# Patient Record
Sex: Female | Born: 1980 | Race: Black or African American | Hispanic: No | Marital: Single | Smoking: Current every day smoker
Health system: Southern US, Community
[De-identification: ages and names within clinical notes are randomized; demographics above are authoritative.]

## PROBLEM LIST (undated history)

## (undated) DIAGNOSIS — J45909 Unspecified asthma, uncomplicated: Secondary | ICD-10-CM

## (undated) DIAGNOSIS — T7840XA Allergy, unspecified, initial encounter: Secondary | ICD-10-CM

## (undated) DIAGNOSIS — A549 Gonococcal infection, unspecified: Secondary | ICD-10-CM

## (undated) DIAGNOSIS — L409 Psoriasis, unspecified: Secondary | ICD-10-CM

## (undated) DIAGNOSIS — B999 Unspecified infectious disease: Secondary | ICD-10-CM

## (undated) DIAGNOSIS — A599 Trichomoniasis, unspecified: Secondary | ICD-10-CM

## (undated) DIAGNOSIS — N83209 Unspecified ovarian cyst, unspecified side: Secondary | ICD-10-CM

## (undated) HISTORY — DX: Unspecified asthma, uncomplicated: J45.909

## (undated) HISTORY — DX: Allergy, unspecified, initial encounter: T78.40XA

## (undated) HISTORY — PX: STOMACH SURGERY: SHX791

---
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
## 1998-01-05 ENCOUNTER — Encounter: Admission: RE | Admit: 1998-01-05 | Discharge: 1998-01-05 | Payer: Self-pay | Admitting: Family Medicine

## 1998-01-12 ENCOUNTER — Encounter: Admission: RE | Admit: 1998-01-12 | Discharge: 1998-01-12 | Payer: Self-pay | Admitting: Family Medicine

## 1998-03-08 ENCOUNTER — Other Ambulatory Visit: Admission: RE | Admit: 1998-03-08 | Discharge: 1998-03-08 | Payer: Self-pay | Admitting: Family Medicine

## 1998-05-27 ENCOUNTER — Encounter: Admission: RE | Admit: 1998-05-27 | Discharge: 1998-05-27 | Payer: Self-pay | Admitting: Family Medicine

## 1998-07-22 ENCOUNTER — Encounter: Admission: RE | Admit: 1998-07-22 | Discharge: 1998-07-22 | Payer: Self-pay | Admitting: Family Medicine

## 1998-08-17 ENCOUNTER — Encounter: Admission: RE | Admit: 1998-08-17 | Discharge: 1998-08-17 | Payer: Self-pay | Admitting: Family Medicine

## 1998-08-24 ENCOUNTER — Encounter: Admission: RE | Admit: 1998-08-24 | Discharge: 1998-08-24 | Payer: Self-pay | Admitting: Family Medicine

## 1998-10-12 ENCOUNTER — Encounter: Admission: RE | Admit: 1998-10-12 | Discharge: 1998-10-12 | Payer: Self-pay | Admitting: Family Medicine

## 1998-10-14 ENCOUNTER — Encounter: Admission: RE | Admit: 1998-10-14 | Discharge: 1998-10-14 | Payer: Self-pay | Admitting: Family Medicine

## 1998-12-04 ENCOUNTER — Inpatient Hospital Stay (HOSPITAL_COMMUNITY): Admission: AD | Admit: 1998-12-04 | Discharge: 1998-12-04 | Payer: Self-pay | Admitting: Obstetrics

## 1998-12-06 ENCOUNTER — Inpatient Hospital Stay (HOSPITAL_COMMUNITY): Admission: AD | Admit: 1998-12-06 | Discharge: 1998-12-06 | Payer: Self-pay | Admitting: *Deleted

## 1999-01-09 ENCOUNTER — Encounter: Admission: RE | Admit: 1999-01-09 | Discharge: 1999-01-09 | Payer: Self-pay | Admitting: Family Medicine

## 1999-01-31 ENCOUNTER — Encounter: Admission: RE | Admit: 1999-01-31 | Discharge: 1999-01-31 | Payer: Self-pay | Admitting: Family Medicine

## 1999-03-02 ENCOUNTER — Encounter: Admission: RE | Admit: 1999-03-02 | Discharge: 1999-03-02 | Payer: Self-pay | Admitting: Family Medicine

## 1999-05-12 ENCOUNTER — Inpatient Hospital Stay (HOSPITAL_COMMUNITY): Admission: AD | Admit: 1999-05-12 | Discharge: 1999-05-12 | Payer: Self-pay | Admitting: Obstetrics & Gynecology

## 1999-05-16 ENCOUNTER — Emergency Department (HOSPITAL_COMMUNITY): Admission: EM | Admit: 1999-05-16 | Discharge: 1999-05-16 | Payer: Self-pay | Admitting: Emergency Medicine

## 1999-05-18 ENCOUNTER — Inpatient Hospital Stay (HOSPITAL_COMMUNITY): Admission: EM | Admit: 1999-05-18 | Discharge: 1999-05-20 | Payer: Self-pay | Admitting: Emergency Medicine

## 1999-05-18 ENCOUNTER — Encounter: Admission: RE | Admit: 1999-05-18 | Discharge: 1999-05-18 | Payer: Self-pay | Admitting: Family Medicine

## 1999-05-25 ENCOUNTER — Encounter: Admission: RE | Admit: 1999-05-25 | Discharge: 1999-05-25 | Payer: Self-pay | Admitting: Family Medicine

## 1999-05-29 ENCOUNTER — Encounter: Admission: RE | Admit: 1999-05-29 | Discharge: 1999-05-29 | Payer: Self-pay | Admitting: Family Medicine

## 1999-07-27 ENCOUNTER — Encounter: Admission: RE | Admit: 1999-07-27 | Discharge: 1999-07-27 | Payer: Self-pay | Admitting: Sports Medicine

## 1999-08-03 ENCOUNTER — Encounter: Admission: RE | Admit: 1999-08-03 | Discharge: 1999-08-03 | Payer: Self-pay | Admitting: Family Medicine

## 1999-11-14 ENCOUNTER — Inpatient Hospital Stay (HOSPITAL_COMMUNITY): Admission: AD | Admit: 1999-11-14 | Discharge: 1999-11-14 | Payer: Self-pay | Admitting: Obstetrics

## 1999-11-27 ENCOUNTER — Encounter: Admission: RE | Admit: 1999-11-27 | Discharge: 1999-11-27 | Payer: Self-pay | Admitting: Family Medicine

## 1999-11-27 ENCOUNTER — Other Ambulatory Visit: Admission: RE | Admit: 1999-11-27 | Discharge: 1999-11-27 | Payer: Self-pay | Admitting: Family Medicine

## 1999-11-29 ENCOUNTER — Encounter (INDEPENDENT_AMBULATORY_CARE_PROVIDER_SITE_OTHER): Payer: Self-pay | Admitting: *Deleted

## 1999-11-29 LAB — CONVERTED CEMR LAB

## 1999-12-21 ENCOUNTER — Encounter: Admission: RE | Admit: 1999-12-21 | Discharge: 1999-12-21 | Payer: Self-pay | Admitting: Family Medicine

## 2000-04-01 ENCOUNTER — Inpatient Hospital Stay (HOSPITAL_COMMUNITY): Admission: AD | Admit: 2000-04-01 | Discharge: 2000-04-01 | Payer: Self-pay | Admitting: Obstetrics

## 2000-08-21 ENCOUNTER — Inpatient Hospital Stay (HOSPITAL_COMMUNITY): Admission: AD | Admit: 2000-08-21 | Discharge: 2000-08-21 | Payer: Self-pay | Admitting: Obstetrics

## 2001-02-14 ENCOUNTER — Emergency Department (HOSPITAL_COMMUNITY): Admission: EM | Admit: 2001-02-14 | Discharge: 2001-02-14 | Payer: Self-pay | Admitting: Emergency Medicine

## 2001-03-06 ENCOUNTER — Emergency Department (HOSPITAL_COMMUNITY): Admission: EM | Admit: 2001-03-06 | Discharge: 2001-03-06 | Payer: Self-pay | Admitting: Emergency Medicine

## 2001-03-17 ENCOUNTER — Emergency Department (HOSPITAL_COMMUNITY): Admission: EM | Admit: 2001-03-17 | Discharge: 2001-03-17 | Payer: Self-pay

## 2001-03-27 ENCOUNTER — Inpatient Hospital Stay (HOSPITAL_COMMUNITY): Admission: AD | Admit: 2001-03-27 | Discharge: 2001-03-27 | Payer: Self-pay | Admitting: Obstetrics & Gynecology

## 2001-07-08 ENCOUNTER — Emergency Department (HOSPITAL_COMMUNITY): Admission: EM | Admit: 2001-07-08 | Discharge: 2001-07-08 | Payer: Self-pay

## 2001-08-21 ENCOUNTER — Emergency Department (HOSPITAL_COMMUNITY): Admission: EM | Admit: 2001-08-21 | Discharge: 2001-08-21 | Payer: Self-pay | Admitting: Emergency Medicine

## 2001-08-25 ENCOUNTER — Encounter: Admission: RE | Admit: 2001-08-25 | Discharge: 2001-08-25 | Payer: Self-pay | Admitting: Family Medicine

## 2001-09-10 ENCOUNTER — Encounter: Admission: RE | Admit: 2001-09-10 | Discharge: 2001-09-10 | Payer: Self-pay | Admitting: Family Medicine

## 2002-10-30 ENCOUNTER — Emergency Department (HOSPITAL_COMMUNITY): Admission: EM | Admit: 2002-10-30 | Discharge: 2002-10-30 | Payer: Self-pay | Admitting: Emergency Medicine

## 2003-01-07 ENCOUNTER — Inpatient Hospital Stay (HOSPITAL_COMMUNITY): Admission: AD | Admit: 2003-01-07 | Discharge: 2003-01-07 | Payer: Self-pay | Admitting: Obstetrics & Gynecology

## 2003-01-07 ENCOUNTER — Encounter: Payer: Self-pay | Admitting: Obstetrics & Gynecology

## 2004-01-17 ENCOUNTER — Emergency Department (HOSPITAL_COMMUNITY): Admission: EM | Admit: 2004-01-17 | Discharge: 2004-01-17 | Payer: Self-pay | Admitting: Emergency Medicine

## 2004-10-01 ENCOUNTER — Emergency Department (HOSPITAL_COMMUNITY): Admission: EM | Admit: 2004-10-01 | Discharge: 2004-10-02 | Payer: Self-pay | Admitting: Emergency Medicine

## 2004-10-12 ENCOUNTER — Other Ambulatory Visit: Admission: RE | Admit: 2004-10-12 | Discharge: 2004-10-12 | Payer: Self-pay | Admitting: Obstetrics and Gynecology

## 2004-11-06 ENCOUNTER — Emergency Department (HOSPITAL_COMMUNITY): Admission: EM | Admit: 2004-11-06 | Discharge: 2004-11-06 | Payer: Self-pay | Admitting: Emergency Medicine

## 2005-06-17 ENCOUNTER — Inpatient Hospital Stay (HOSPITAL_COMMUNITY): Admission: AD | Admit: 2005-06-17 | Discharge: 2005-06-17 | Payer: Self-pay | Admitting: *Deleted

## 2005-06-21 ENCOUNTER — Emergency Department (HOSPITAL_COMMUNITY): Admission: EM | Admit: 2005-06-21 | Discharge: 2005-06-21 | Payer: Self-pay | Admitting: Emergency Medicine

## 2005-08-03 ENCOUNTER — Inpatient Hospital Stay (HOSPITAL_COMMUNITY): Admission: AD | Admit: 2005-08-03 | Discharge: 2005-08-03 | Payer: Self-pay | Admitting: Obstetrics and Gynecology

## 2006-01-23 ENCOUNTER — Emergency Department (HOSPITAL_COMMUNITY): Admission: EM | Admit: 2006-01-23 | Discharge: 2006-01-23 | Payer: Self-pay | Admitting: Emergency Medicine

## 2006-06-27 DIAGNOSIS — L989 Disorder of the skin and subcutaneous tissue, unspecified: Secondary | ICD-10-CM

## 2006-06-27 HISTORY — DX: Disorder of the skin and subcutaneous tissue, unspecified: L98.9

## 2006-06-28 ENCOUNTER — Encounter (INDEPENDENT_AMBULATORY_CARE_PROVIDER_SITE_OTHER): Payer: Self-pay | Admitting: *Deleted

## 2006-07-14 ENCOUNTER — Inpatient Hospital Stay (HOSPITAL_COMMUNITY): Admission: AD | Admit: 2006-07-14 | Discharge: 2006-07-14 | Payer: Self-pay | Admitting: Gynecology

## 2006-10-05 ENCOUNTER — Emergency Department (HOSPITAL_COMMUNITY): Admission: EM | Admit: 2006-10-05 | Discharge: 2006-10-05 | Payer: Self-pay | Admitting: Emergency Medicine

## 2006-10-14 ENCOUNTER — Emergency Department (HOSPITAL_COMMUNITY): Admission: EM | Admit: 2006-10-14 | Discharge: 2006-10-14 | Payer: Self-pay | Admitting: Family Medicine

## 2006-11-25 ENCOUNTER — Inpatient Hospital Stay (HOSPITAL_COMMUNITY): Admission: AD | Admit: 2006-11-25 | Discharge: 2006-11-25 | Payer: Self-pay | Admitting: Obstetrics and Gynecology

## 2007-12-21 ENCOUNTER — Inpatient Hospital Stay (HOSPITAL_COMMUNITY): Admission: AD | Admit: 2007-12-21 | Discharge: 2007-12-21 | Payer: Self-pay | Admitting: Obstetrics & Gynecology

## 2008-04-08 ENCOUNTER — Ambulatory Visit: Payer: Self-pay | Admitting: Obstetrics & Gynecology

## 2008-04-08 ENCOUNTER — Encounter: Payer: Self-pay | Admitting: Physician Assistant

## 2008-04-08 LAB — CONVERTED CEMR LAB: HCV Ab: NEGATIVE

## 2008-04-09 ENCOUNTER — Encounter: Payer: Self-pay | Admitting: Obstetrics & Gynecology

## 2008-04-09 LAB — CONVERTED CEMR LAB: Trich, Wet Prep: NONE SEEN

## 2008-07-09 ENCOUNTER — Emergency Department (HOSPITAL_COMMUNITY): Admission: EM | Admit: 2008-07-09 | Discharge: 2008-07-09 | Payer: Self-pay | Admitting: Emergency Medicine

## 2009-02-06 ENCOUNTER — Inpatient Hospital Stay (HOSPITAL_COMMUNITY): Admission: AD | Admit: 2009-02-06 | Discharge: 2009-02-06 | Payer: Self-pay | Admitting: Obstetrics and Gynecology

## 2009-02-06 ENCOUNTER — Ambulatory Visit: Payer: Self-pay | Admitting: Family

## 2009-06-26 ENCOUNTER — Emergency Department (HOSPITAL_COMMUNITY): Admission: EM | Admit: 2009-06-26 | Discharge: 2009-06-26 | Payer: Self-pay | Admitting: Emergency Medicine

## 2009-08-17 ENCOUNTER — Ambulatory Visit: Payer: Self-pay | Admitting: Obstetrics & Gynecology

## 2009-08-18 ENCOUNTER — Encounter: Payer: Self-pay | Admitting: Obstetrics and Gynecology

## 2009-08-19 ENCOUNTER — Encounter: Payer: Self-pay | Admitting: Obstetrics & Gynecology

## 2009-08-19 LAB — CONVERTED CEMR LAB

## 2009-11-16 ENCOUNTER — Inpatient Hospital Stay (HOSPITAL_COMMUNITY): Admission: EM | Admit: 2009-11-16 | Discharge: 2009-11-18 | Payer: Self-pay | Admitting: Emergency Medicine

## 2009-11-16 ENCOUNTER — Encounter: Payer: Self-pay | Admitting: Emergency Medicine

## 2009-11-16 ENCOUNTER — Ambulatory Visit: Payer: Self-pay | Admitting: Diagnostic Radiology

## 2010-01-06 ENCOUNTER — Ambulatory Visit: Payer: Self-pay | Admitting: Diagnostic Radiology

## 2010-01-06 ENCOUNTER — Emergency Department (HOSPITAL_BASED_OUTPATIENT_CLINIC_OR_DEPARTMENT_OTHER): Admission: EM | Admit: 2010-01-06 | Discharge: 2010-01-06 | Payer: Self-pay | Admitting: Emergency Medicine

## 2010-05-22 ENCOUNTER — Encounter: Payer: Self-pay | Admitting: *Deleted

## 2010-07-13 LAB — DIFFERENTIAL
Basophils Relative: 3 % — ABNORMAL HIGH (ref 0–1)
Eosinophils Absolute: 0 10*3/uL (ref 0.0–0.7)
Eosinophils Relative: 1 % (ref 0–5)
Lymphs Abs: 1 10*3/uL (ref 0.7–4.0)
Monocytes Absolute: 0.5 10*3/uL (ref 0.1–1.0)
Monocytes Relative: 12 % (ref 3–12)
Neutrophils Relative %: 59 % (ref 43–77)

## 2010-07-13 LAB — CBC
HCT: 39.8 % (ref 36.0–46.0)
Hemoglobin: 13.1 g/dL (ref 12.0–15.0)
MCH: 26.4 pg (ref 26.0–34.0)
MCHC: 32.9 g/dL (ref 30.0–36.0)
MCV: 80.2 fL (ref 78.0–100.0)
RBC: 4.97 MIL/uL (ref 3.87–5.11)

## 2010-07-13 LAB — URINALYSIS, ROUTINE W REFLEX MICROSCOPIC
Nitrite: NEGATIVE
Specific Gravity, Urine: 1.005 (ref 1.005–1.030)
Urobilinogen, UA: 1 mg/dL (ref 0.0–1.0)

## 2010-07-13 LAB — WET PREP, GENITAL
Trich, Wet Prep: NONE SEEN
WBC, Wet Prep HPF POC: NONE SEEN

## 2010-07-13 LAB — GC/CHLAMYDIA PROBE AMP, GENITAL: GC Probe Amp, Genital: NEGATIVE

## 2010-07-15 LAB — CBC
HCT: 47 % — ABNORMAL HIGH (ref 36.0–46.0)
MCH: 25.7 pg — ABNORMAL LOW (ref 26.0–34.0)
MCH: 25.7 pg — ABNORMAL LOW (ref 26.0–34.0)
MCHC: 32 g/dL (ref 30.0–36.0)
MCV: 79.4 fL (ref 78.0–100.0)
MCV: 80.3 fL (ref 78.0–100.0)
Platelets: 185 10*3/uL (ref 150–400)
Platelets: 152 10*3/uL (ref 150–400)
RBC: 5.93 MIL/uL — ABNORMAL HIGH (ref 3.87–5.11)
RDW: 16.9 % — ABNORMAL HIGH (ref 11.5–15.5)
RDW: 18 % — ABNORMAL HIGH (ref 11.5–15.5)
WBC: 9.2 10*3/uL (ref 4.0–10.5)

## 2010-07-15 LAB — DIFFERENTIAL
Basophils Absolute: 0 10*3/uL (ref 0.0–0.1)
Basophils Relative: 0 % (ref 0–1)
Eosinophils Absolute: 0 10*3/uL (ref 0.0–0.7)
Eosinophils Absolute: 0 10*3/uL (ref 0.0–0.7)
Eosinophils Relative: 0 % (ref 0–5)
Eosinophils Relative: 0 % (ref 0–5)
Lymphocytes Relative: 10 % — ABNORMAL LOW (ref 12–46)
Lymphs Abs: 0.9 10*3/uL (ref 0.7–4.0)
Monocytes Absolute: 0.8 10*3/uL (ref 0.1–1.0)
Neutrophils Relative %: 84 % — ABNORMAL HIGH (ref 43–77)

## 2010-07-15 LAB — BASIC METABOLIC PANEL
BUN: 4 mg/dL — ABNORMAL LOW (ref 6–23)
BUN: <1 mg/dL — ABNORMAL LOW (ref 6–23)
CO2: 24 mEq/L (ref 19–32)
CO2: 29 mEq/L (ref 19–32)
Calcium: 8.4 mg/dL (ref 8.4–10.5)
Chloride: 107 mEq/L (ref 96–112)
Chloride: 100 mEq/L (ref 96–112)
Creatinine, Ser: 0.5 mg/dL (ref 0.4–1.2)
Creatinine, Ser: 0.46 mg/dL (ref 0.4–1.2)
GFR calc Af Amer: >60 mL/min (ref >60)
Potassium: 3.3 mEq/L — ABNORMAL LOW (ref 3.5–5.1)

## 2010-07-15 LAB — URINALYSIS, ROUTINE W REFLEX MICROSCOPIC
Bilirubin Urine: NEGATIVE
Glucose, UA: NEGATIVE mg/dL
Hgb urine dipstick: NEGATIVE
Ketones, ur: NEGATIVE mg/dL
Protein, ur: NEGATIVE mg/dL
pH: 7 (ref 5.0–8.0)

## 2010-07-21 LAB — URINALYSIS, ROUTINE W REFLEX MICROSCOPIC
Glucose, UA: NEGATIVE mg/dL
Ketones, ur: >80 mg/dL — AB
Nitrite: NEGATIVE
Protein, ur: 30 mg/dL — AB
Urobilinogen, UA: 1 mg/dL (ref 0.0–1.0)

## 2010-07-21 LAB — URINE MICROSCOPIC-ADD ON

## 2010-08-03 LAB — URINALYSIS, ROUTINE W REFLEX MICROSCOPIC
Bilirubin Urine: NEGATIVE
Hgb urine dipstick: NEGATIVE
Specific Gravity, Urine: 1.025 (ref 1.005–1.030)
Specific Gravity, Urine: >1.03 — ABNORMAL HIGH (ref 1.005–1.030)
Urobilinogen, UA: 1 mg/dL (ref 0.0–1.0)
pH: 6 (ref 5.0–8.0)
pH: 6 (ref 5.0–8.0)

## 2010-08-03 LAB — GC/CHLAMYDIA PROBE AMP, GENITAL: Chlamydia, DNA Probe: NEGATIVE

## 2010-08-03 LAB — WET PREP, GENITAL: Yeast Wet Prep HPF POC: NONE SEEN

## 2010-08-03 LAB — URINE MICROSCOPIC-ADD ON

## 2010-08-10 LAB — POCT I-STAT, CHEM 8
Calcium, Ion: 1.09 mmol/L — ABNORMAL LOW (ref 1.12–1.32)
HCT: 50 % — ABNORMAL HIGH (ref 36.0–46.0)
Hemoglobin: 17 g/dL — ABNORMAL HIGH (ref 12.0–15.0)
TCO2: 22 mmol/L (ref 0–100)
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
## 2011-02-12 LAB — URINE MICROSCOPIC-ADD ON

## 2011-02-12 LAB — URINALYSIS, ROUTINE W REFLEX MICROSCOPIC
Hgb urine dipstick: NEGATIVE
Nitrite: NEGATIVE
Specific Gravity, Urine: >1.03 — ABNORMAL HIGH
pH: 5.5

## 2011-02-12 LAB — GC/CHLAMYDIA PROBE AMP, GENITAL: Chlamydia, DNA Probe: NEGATIVE

## 2011-02-12 LAB — WET PREP, GENITAL
Trich, Wet Prep: NONE SEEN
Yeast Wet Prep HPF POC: NONE SEEN

## 2011-02-12 LAB — CBC
MCHC: 32.6
RDW: 13.7

## 2011-02-12 LAB — POCT PREGNANCY, URINE: Preg Test, Ur: NEGATIVE

## 2011-08-22 ENCOUNTER — Emergency Department (HOSPITAL_COMMUNITY): Payer: Self-pay

## 2011-08-22 ENCOUNTER — Encounter (HOSPITAL_COMMUNITY): Payer: Self-pay | Admitting: *Deleted

## 2011-08-22 ENCOUNTER — Emergency Department (HOSPITAL_COMMUNITY)
Admission: EM | Admit: 2011-08-22 | Discharge: 2011-08-22 | Disposition: A | Discharge status: Home / Self Care | Payer: Self-pay | Attending: Emergency Medicine | Admitting: Emergency Medicine

## 2011-08-22 DIAGNOSIS — N390 Urinary tract infection, site not specified: Secondary | ICD-10-CM | POA: Insufficient documentation

## 2011-08-22 DIAGNOSIS — F17200 Nicotine dependence, unspecified, uncomplicated: Secondary | ICD-10-CM | POA: Insufficient documentation

## 2011-08-22 DIAGNOSIS — R079 Chest pain, unspecified: Secondary | ICD-10-CM | POA: Insufficient documentation

## 2011-08-22 DIAGNOSIS — J40 Bronchitis, not specified as acute or chronic: Secondary | ICD-10-CM | POA: Insufficient documentation

## 2011-08-22 DIAGNOSIS — R0602 Shortness of breath: Secondary | ICD-10-CM | POA: Insufficient documentation

## 2011-08-22 DIAGNOSIS — R109 Unspecified abdominal pain: Secondary | ICD-10-CM | POA: Insufficient documentation

## 2011-08-22 LAB — BASIC METABOLIC PANEL
CO2: 27 mEq/L (ref 19–32)
Chloride: 99 mEq/L (ref 96–112)
Creatinine, Ser: 0.6 mg/dL (ref 0.50–1.10)
GFR calc Af Amer: >90 mL/min (ref >90)
Potassium: 4 mEq/L (ref 3.5–5.1)
Sodium: 137 mEq/L (ref 135–145)

## 2011-08-22 LAB — URINE MICROSCOPIC-ADD ON

## 2011-08-22 LAB — URINALYSIS, ROUTINE W REFLEX MICROSCOPIC
Bilirubin Urine: NEGATIVE
Glucose, UA: NEGATIVE mg/dL
Nitrite: POSITIVE — AB
Specific Gravity, Urine: 1.006 (ref 1.005–1.030)
pH: 7 (ref 5.0–8.0)

## 2011-08-22 LAB — DIFFERENTIAL
Basophils Absolute: 0 10*3/uL (ref 0.0–0.1)
Eosinophils Relative: 0 % (ref 0–5)
Lymphocytes Relative: 33 % (ref 12–46)
Lymphs Abs: 1.1 10*3/uL (ref 0.7–4.0)
Monocytes Absolute: 1.1 10*3/uL — ABNORMAL HIGH (ref 0.1–1.0)
Neutro Abs: 1.1 10*3/uL — ABNORMAL LOW (ref 1.7–7.7)

## 2011-08-22 LAB — CBC
HCT: 46.7 % — ABNORMAL HIGH (ref 36.0–46.0)
MCV: 82.8 fL (ref 78.0–100.0)
RBC: 5.64 MIL/uL — ABNORMAL HIGH (ref 3.87–5.11)
RDW: 15.7 % — ABNORMAL HIGH (ref 11.5–15.5)
WBC: 3.3 10*3/uL — ABNORMAL LOW (ref 4.0–10.5)

## 2011-08-22 MED ORDER — CEPHALEXIN 500 MG PO CAPS: 500.0000 mg | ORAL_CAPSULE | Freq: Two times a day (BID) | ORAL | Status: AC

## 2011-08-22 MED ORDER — CEPHALEXIN 250 MG PO CAPS
500.0000 mg | ORAL_CAPSULE | Freq: Once | ORAL | Status: AC
  Administered 2011-08-22: 500 mg via ORAL
  Filled 2011-08-22: qty 2

## 2011-08-22 MED ORDER — ALBUTEROL SULFATE HFA 108 (90 BASE) MCG/ACT IN AERS
2.0000 | INHALATION_SPRAY | Freq: Four times a day (QID) | RESPIRATORY_TRACT | Status: DC
  Administered 2011-08-22: 2 via RESPIRATORY_TRACT
  Filled 2011-08-22: qty 6.7

## 2011-08-22 MED ORDER — SODIUM CHLORIDE 0.9 % IV BOLUS (SEPSIS)
1000.0000 mL | Freq: Once | INTRAVENOUS | Status: AC
  Administered 2011-08-22: 1000 mL via INTRAVENOUS

## 2011-08-22 NOTE — ED Notes (Signed)
Patient transported to X-ray 

## 2011-08-22 NOTE — ED Provider Notes (Signed)
History     CSN: [id number]  Arrival date & time 08/22/11  1504   First MD Initiated Contact with Patient 08/22/11 [phone]      Chief Complaint  Patient presents with  . Chest Pain  . Abdominal Pain  . Shortness of Breath    HPI The patient presents with concerns of chest pain, dyspnea.  Shows that prior to yesterday she was in her usual state of health.  She smokes approximately 0.5 packs of cigarettes daily.  She notes that since yesterday she has had persistent chest discomfort, anterior, tight.  Chills, or current dyspnea.  The chest pain is worse with coughing or inspiration.  She notes subjective fever as well.  The pain is not exertional.  There are no other clear exacerbating factors.  No relief with OTC medication or Tusinex  .History reviewed. No pertinent past medical history.  History reviewed. No pertinent past surgical history.  History reviewed. No pertinent family history.  History  Substance Use Topics  . Smoking status: Current Everyday Smoker -- 0.5 packs/day    Types: Cigarettes  . Smokeless tobacco: Not on file  . Alcohol Use: No    OB History    Grav Para Term Preterm Abortions TAB SAB Ect Mult Living                  Review of Systems  Constitutional:       HPI  HENT:       HPI otherwise negative  Eyes: Negative.   Respiratory:       HPI, otherwise negative  Cardiovascular:       HPI, otherwise nmegative  Gastrointestinal: Positive for abdominal pain. Negative for nausea, vomiting and diarrhea.  Genitourinary: Negative for vaginal bleeding, vaginal discharge and vaginal pain.       HPI, otherwise negative  Musculoskeletal:       HPI, otherwise negative  Skin: Negative.   Neurological: Negative for syncope.    Allergies  Review of patient's allergies indicates no known allergies.  Home Medications   Current Outpatient Rx  Name Route Sig Dispense Refill  . HYDROCOD POLST-CPM POLST ER 10-8 MG/5ML PO LQCR Oral Take 5 mLs by mouth at  bedtime as needed. For congestion      BP 109/50  Pulse 85  Temp 98.3 F (36.8 C)  Resp 18  SpO2 99%  Physical Exam  Nursing note and vitals reviewed. Constitutional: She is oriented to person, place, and time. She appears well-developed and well-nourished. No distress.  HENT:  Head: Normocephalic and atraumatic.  Eyes: Conjunctivae and EOM are normal.  Cardiovascular: Normal rate and regular rhythm.   Pulmonary/Chest: Effort normal and breath sounds normal. No stridor. No respiratory distress.  Abdominal: She exhibits no distension.  Musculoskeletal: She exhibits no edema and no tenderness.  Neurological: She is alert and oriented to person, place, and time. No cranial nerve deficit.  Skin: Skin is warm and dry.  Psychiatric: She has a normal mood and affect.    ED Course  Procedures (including critical care time)  Labs Reviewed - No data to display No results found.   No diagnosis found.  Cardiac monitor 81 sinus rhythm and normal Pulse oximetry 99% room air normal   On re-eval the patient adds that she is having dysuria.  No lower abdominal pain. MDM  This previously well young female presents with new chest pain, shortness of breath, crampy lower abdominal pain.  The patient denies any vaginal complaints, syncope, exertional  pain.  On my exam the patient is in no distress with unremarkable vital signs.  The patient's lungs are clear.  The patient's chest x-ray and labs are most consistent with bronchitis and a urinary tract infection.  She received a first dose of ABX, as well as an albuterol inhaler and was d/c in stable condition.        Gerhard Munch, MD 08/22/11 [phone]

## 2011-08-22 NOTE — Discharge Instructions (Signed)
Please be sure to use the provided albuterol inhaler up to 6 times daily for relief of your respiratory symptoms.  If you develop any new, or concerning changes in your condition, such as fever does not improve with Tylenol, intractable pain, persistent nausea or vomiting, please return to emergency department for further evaluation.

## 2011-08-22 NOTE — ED Notes (Signed)
MD at bedside. 

## 2011-08-22 NOTE — ED Notes (Signed)
Pt resting in stretcher in NAD, respirations even and unlabored.  Provided pt with extra blanket.

## 2011-08-22 NOTE — ED Notes (Signed)
Multiple complaints, having chest pain, sob and abd cramping that all started yesterday. ekg done at triage, no distress noted.

## 2012-01-06 ENCOUNTER — Inpatient Hospital Stay (HOSPITAL_COMMUNITY)
Admission: AD | Admit: 2012-01-06 | Discharge: 2012-01-06 | Disposition: A | Discharge status: Home / Self Care | Payer: Self-pay | Source: Ambulatory Visit | Attending: Obstetrics & Gynecology | Admitting: Obstetrics & Gynecology

## 2012-01-06 ENCOUNTER — Encounter (HOSPITAL_COMMUNITY): Payer: Self-pay | Admitting: Obstetrics and Gynecology

## 2012-01-06 ENCOUNTER — Inpatient Hospital Stay (HOSPITAL_COMMUNITY): Payer: Self-pay

## 2012-01-06 DIAGNOSIS — R109 Unspecified abdominal pain: Secondary | ICD-10-CM | POA: Insufficient documentation

## 2012-01-06 DIAGNOSIS — A5901 Trichomonal vulvovaginitis: Secondary | ICD-10-CM | POA: Insufficient documentation

## 2012-01-06 DIAGNOSIS — N946 Dysmenorrhea, unspecified: Secondary | ICD-10-CM | POA: Insufficient documentation

## 2012-01-06 DIAGNOSIS — A599 Trichomoniasis, unspecified: Secondary | ICD-10-CM

## 2012-01-06 DIAGNOSIS — R112 Nausea with vomiting, unspecified: Secondary | ICD-10-CM | POA: Insufficient documentation

## 2012-01-06 LAB — COMPREHENSIVE METABOLIC PANEL
Albumin: 4 g/dL (ref 3.5–5.2)
BUN: 6 mg/dL (ref 6–23)
Calcium: 9.2 mg/dL (ref 8.4–10.5)
Chloride: 100 mEq/L (ref 96–112)
Creatinine, Ser: 0.46 mg/dL — ABNORMAL LOW (ref 0.50–1.10)
Total Bilirubin: 0.4 mg/dL (ref 0.3–1.2)
Total Protein: 7.5 g/dL (ref 6.0–8.3)

## 2012-01-06 LAB — URINE MICROSCOPIC-ADD ON

## 2012-01-06 LAB — CBC
HCT: 42.3 % (ref 36.0–46.0)
MCH: 28.1 pg (ref 26.0–34.0)
MCHC: 33.8 g/dL (ref 30.0–36.0)
MCV: 83.3 fL (ref 78.0–100.0)
RDW: 13.9 % (ref 11.5–15.5)

## 2012-01-06 LAB — URINALYSIS, ROUTINE W REFLEX MICROSCOPIC
Bilirubin Urine: NEGATIVE
Nitrite: POSITIVE — AB
Protein, ur: 100 mg/dL — AB
Urobilinogen, UA: 2 mg/dL — ABNORMAL HIGH (ref 0.0–1.0)

## 2012-01-06 LAB — WET PREP, GENITAL: Clue Cells Wet Prep HPF POC: NONE SEEN

## 2012-01-06 MED ORDER — PROMETHAZINE HCL 25 MG PO TABS: 25.0000 mg | ORAL_TABLET | Freq: Four times a day (QID) | ORAL | Status: DC | PRN

## 2012-01-06 MED ORDER — METRONIDAZOLE 500 MG PO TABS: 2000.0000 mg | ORAL_TABLET | Freq: Once | ORAL | Status: AC

## 2012-01-06 MED ORDER — KETOROLAC TROMETHAMINE 60 MG/2ML IM SOLN
60.0000 mg | Freq: Once | INTRAMUSCULAR | Status: AC
  Administered 2012-01-06: 60 mg via INTRAMUSCULAR
  Filled 2012-01-06: qty 2

## 2012-01-06 MED ORDER — IBUPROFEN 800 MG PO TABS: 800.0000 mg | ORAL_TABLET | Freq: Three times a day (TID) | ORAL | Status: AC | PRN

## 2012-01-06 MED ORDER — PROMETHAZINE HCL 25 MG/ML IJ SOLN
25.0000 mg | Freq: Four times a day (QID) | INTRAMUSCULAR | Status: DC | PRN
  Administered 2012-01-06: 25 mg via INTRAMUSCULAR
  Filled 2012-01-06: qty 1

## 2012-01-06 NOTE — MAU Provider Note (Signed)
History     CSN: [id number]  Arrival date and time: 01/06/12 1300   First Provider Initiated Contact with Patient 01/06/12 1418      Chief Complaint  Patient presents with  . Emesis   HPI 31 y.o. G0P0000 with n/v and low abd pain starting today. Unable to tolerate food or drink. + chills, no fever, diarrhea, back pain, dysuria.    Past Medical History  Diagnosis Date  . No pertinent past medical history     Past Surgical History  Procedure Date  . Stomach surgery     History reviewed. No pertinent family history.  History  Substance Use Topics  . Smoking status: Current Everyday Smoker -- 0.5 packs/day    Types: Cigarettes  . Smokeless tobacco: Not on file  . Alcohol Use: No    Allergies: No Known Allergies  Prescriptions prior to admission  Medication Sig Dispense Refill  . albuterol (PROVENTIL HFA;VENTOLIN HFA) 108 (90 BASE) MCG/ACT inhaler Inhale 2 puffs into the lungs every 6 (six) hours as needed. SOB      . naproxen sodium (ANAPROX) 220 MG tablet Take 220 mg by mouth 2 (two) times daily as needed. pain        Review of Systems  Constitutional: Positive for chills and malaise/fatigue.  Respiratory: Negative.   Cardiovascular: Negative.   Gastrointestinal: Positive for nausea, vomiting and abdominal pain. Negative for diarrhea and constipation.  Genitourinary: Negative for dysuria, urgency, frequency, hematuria and flank pain.       Positive for vaginal bleeding (menses - heavier than usual), white vaginal discharge   Musculoskeletal: Negative.   Neurological: Negative.   Psychiatric/Behavioral: Negative.    Physical Exam   Blood pressure 120/70, pulse 67, temperature 97.8 F (36.6 C), temperature source Oral, resp. rate 18, height 5\' 5"  (1.651 m), weight 107 lb 6.4 oz (48.716 kg), last menstrual period 01/04/2012.  Physical Exam  Nursing note and vitals reviewed. Constitutional: She is oriented to person, place, and time. She appears well-developed  and well-nourished. No distress.  HENT:  Head: Normocephalic and atraumatic.  Cardiovascular: Normal rate, regular rhythm and normal heart sounds.   Respiratory: Effort normal and breath sounds normal. No respiratory distress.  GI: Soft. Bowel sounds are normal. She exhibits no distension and no mass. There is no tenderness. There is no rebound, no guarding and no CVA tenderness.  Genitourinary: There is no rash or lesion on the right labia. There is no rash or lesion on the left labia. Uterus is tender. Uterus is not deviated, not enlarged and not fixed. Cervix exhibits no motion tenderness, no discharge and no friability. Right adnexum displays no mass, no tenderness and no fullness. Left adnexum displays tenderness. Left adnexum displays no mass and no fullness. There is bleeding (moderate to heavy) around the vagina. No erythema or tenderness around the vagina. No vaginal discharge found.  Neurological: She is alert and oriented to person, place, and time.  Skin: Skin is warm and dry.  Psychiatric: She has a normal mood and affect.    MAU Course  Procedures  Results for orders placed during the hospital encounter of 01/06/12 (from the past 24 hour(s))  URINALYSIS, ROUTINE W REFLEX MICROSCOPIC     Status: Abnormal   Collection Time   01/06/12  1:30 PM      Component Value Range   Color, Urine RED (*) YELLOW   APPearance CLOUDY (*) CLEAR   Specific Gravity, Urine 1.020  1.005 - 1.030   pH 7.5  5.0 - 8.0   Glucose, UA 100 (*) NEGATIVE mg/dL   Hgb urine dipstick LARGE (*) NEGATIVE   Bilirubin Urine NEGATIVE  NEGATIVE   Ketones, ur 15 (*) NEGATIVE mg/dL   Protein, ur 161 (*) NEGATIVE mg/dL   Urobilinogen, UA 2.0 (*) 0.0 - 1.0 mg/dL   Nitrite POSITIVE (*) NEGATIVE   Leukocytes, UA SMALL (*) NEGATIVE  URINE MICROSCOPIC-ADD ON     Status: Abnormal   Collection Time   01/06/12  1:30 PM      Component Value Range   Squamous Epithelial / LPF RARE  RARE   WBC, UA 3-6  <3 WBC/hpf   RBC / HPF  TOO NUMEROUS TO COUNT  <3 RBC/hpf   Bacteria, UA FEW (*) RARE   Urine-Other TRICHOMONAS PRESENT    POCT PREGNANCY, URINE     Status: Normal   Collection Time   01/06/12  1:42 PM      Component Value Range   Preg Test, Ur NEGATIVE  NEGATIVE  CBC     Status: Normal   Collection Time   01/06/12  2:04 PM      Component Value Range   WBC 7.1  4.0 - 10.5 K/uL   RBC 5.08  3.87 - 5.11 MIL/uL   Hemoglobin 14.3  12.0 - 15.0 g/dL   HCT 09.6  04.5 - 40.9 %   MCV 83.3  78.0 - 100.0 fL   MCH 28.1  26.0 - 34.0 pg   MCHC 33.8  30.0 - 36.0 g/dL   RDW 81.1  91.4 - 78.2 %   Platelets 199  150 - 400 K/uL  COMPREHENSIVE METABOLIC PANEL     Status: Abnormal   Collection Time   01/06/12  2:04 PM      Component Value Range   Sodium 136  135 - 145 mEq/L   Potassium 4.0  3.5 - 5.1 mEq/L   Chloride 100  96 - 112 mEq/L   CO2 27  19 - 32 mEq/L   Glucose, Bld 111 (*) 70 - 99 mg/dL   BUN 6  6 - 23 mg/dL   Creatinine, Ser 9.56 (*) 0.50 - 1.10 mg/dL   Calcium 9.2  8.4 - 21.3 mg/dL   Total Protein 7.5  6.0 - 8.3 g/dL   Albumin 4.0  3.5 - 5.2 g/dL   AST 20  0 - 37 U/L   ALT 12  0 - 35 U/L   Alkaline Phosphatase 67  39 - 117 U/L   Total Bilirubin 0.4  0.3 - 1.2 mg/dL   GFR calc non Af Amer >90  >90 mL/min   GFR calc Af Amer >90  >90 mL/min  WET PREP, GENITAL     Status: Abnormal   Collection Time   01/06/12  2:22 PM      Component Value Range   Yeast Wet Prep HPF POC NONE SEEN  NONE SEEN   Trich, Wet Prep FEW (*) NONE SEEN   Clue Cells Wet Prep HPF POC NONE SEEN  NONE SEEN   WBC, Wet Prep HPF POC FEW (*) NONE SEEN   US Transvaginal Non-ob  01/06/2012  *RADIOLOGY REPORT*  Clinical Data: Pelvic pain.  Heavy menses.  LMP 01/04/2012  TRANSABDOMINAL AND TRANSVAGINAL ULTRASOUND OF PELVIS Technique:  Both transabdominal and transvaginal ultrasound examinations of the pelvis were performed. Transabdominal technique was performed for global imaging of the pelvis including uterus, ovaries, adnexal regions, and pelvic  cul-de-sac.  It was necessary to proceed with  endovaginal exam following the transabdominal exam to visualize the uterus and endometrium.  Comparison:  Pelvic ultrasound 01/06/2010  Findings:  Uterus: Measures 8.4 x 3.6 x 4.6 cm.  Anteverted.  Normal in echotexture.  No focal uterine mass.  Endometrium: Normal in thickness and appearance.  Measures 7 mm maximal thickness in the lower uterine segment.  Right ovary:  Normal appearance of the right ovary.  Measures 4.2 x 2.3 x 2.7 cm.  There is a 0.8 x 0.6 x 0.9 cm paraovarian cyst, simple.  Left ovary: Normal appearance/no adnexal mass. Measures 3.8 x 1.8- 0.2 cm  Other findings: A trace amount of free pelvic fluid.  IMPRESSION: The uterus and ovaries are within normal limits.  Benign 0.9 cm paraovarian cyst in the right adnexa.   Original Report Authenticated By: Britta Mccreedy, M.D.    US Pelvis Complete  01/06/2012  *RADIOLOGY REPORT*  Clinical Data: Pelvic pain.  Heavy menses.  LMP 01/04/2012  TRANSABDOMINAL AND TRANSVAGINAL ULTRASOUND OF PELVIS Technique:  Both transabdominal and transvaginal ultrasound examinations of the pelvis were performed. Transabdominal technique was performed for global imaging of the pelvis including uterus, ovaries, adnexal regions, and pelvic cul-de-sac.  It was necessary to proceed with endovaginal exam following the transabdominal exam to visualize the uterus and endometrium.  Comparison:  Pelvic ultrasound 01/06/2010  Findings:  Uterus: Measures 8.4 x 3.6 x 4.6 cm.  Anteverted.  Normal in echotexture.  No focal uterine mass.  Endometrium: Normal in thickness and appearance.  Measures 7 mm maximal thickness in the lower uterine segment.  Right ovary:  Normal appearance of the right ovary.  Measures 4.2 x 2.3 x 2.7 cm.  There is a 0.8 x 0.6 x 0.9 cm paraovarian cyst, simple.  Left ovary: Normal appearance/no adnexal mass. Measures 3.8 x 1.8- 0.2 cm  Other findings: A trace amount of free pelvic fluid.  IMPRESSION: The uterus and  ovaries are within normal limits.  Benign 0.9 cm paraovarian cyst in the right adnexa.   Original Report Authenticated By: Britta Mccreedy, M.D.     Assessment and Plan   1. Trichomonas   2. Dysmenorrhea   3. Nausea and vomiting    Medication List  As of 01/06/2012  6:33 PM   STOP taking these medications         naproxen sodium 220 MG tablet         TAKE these medications         albuterol 108 (90 BASE) MCG/ACT inhaler   Commonly known as: PROVENTIL HFA;VENTOLIN HFA   Inhale 2 puffs into the lungs every 6 (six) hours as needed. SOB      ibuprofen 800 MG tablet   Commonly known as: ADVIL,MOTRIN   Take 1 tablet (800 mg total) by mouth every 8 (eight) hours as needed for pain.      metroNIDAZOLE 500 MG tablet   Commonly known as: FLAGYL   Take 4 tablets (2,000 mg total) by mouth once.      promethazine 25 MG tablet   Commonly known as: PHENERGAN   Take 1 tablet (25 mg total) by mouth every 6 (six) hours as needed for nausea.           Follow-up Information    Follow up with [facility]. (If symptoms worsen)    Contact information:   [address] [place] [address] [phone]          Sammie Schermerhorn 01/06/2012, 2:19 PM

## 2012-01-06 NOTE — MAU Provider Note (Signed)
Attestation of Attending Supervision of Advanced Practitioner (CNM/NP): Evaluation and management procedures were performed by the Advanced Practitioner under my supervision and collaboration.  I have reviewed the Advanced Practitioner's note and chart, and I agree with the management and plan.  HARRAWAY-SMITH, Dorie Ohms 8:14 PM

## 2012-01-06 NOTE — MAU Note (Signed)
Pt presents to MAU with chief complaint of emeses and abdominal cramping. Pt is not pregnant- says her periods are regular. Pt says she made hamburger helper last night and states the meat was fully cooked. Pt denies diarrhea, or fever

## 2012-01-06 NOTE — MAU Note (Addendum)
Pt reports having n/v since yesterday unable to keep anything doen. Having hot and cold chills and abd pain. Pt does not think she is pregnant. LMP 01/04/12

## 2012-04-27 ENCOUNTER — Encounter (HOSPITAL_COMMUNITY): Payer: Self-pay | Admitting: *Deleted

## 2012-04-27 ENCOUNTER — Inpatient Hospital Stay (HOSPITAL_COMMUNITY)
Admission: AD | Admit: 2012-04-27 | Discharge: 2012-04-27 | Disposition: A | Discharge status: Home / Self Care | Payer: Self-pay | Source: Ambulatory Visit | Attending: Obstetrics & Gynecology | Admitting: Obstetrics & Gynecology

## 2012-04-27 DIAGNOSIS — N390 Urinary tract infection, site not specified: Secondary | ICD-10-CM | POA: Insufficient documentation

## 2012-04-27 DIAGNOSIS — R109 Unspecified abdominal pain: Secondary | ICD-10-CM | POA: Insufficient documentation

## 2012-04-27 LAB — URINALYSIS, ROUTINE W REFLEX MICROSCOPIC
Bilirubin Urine: NEGATIVE
Glucose, UA: NEGATIVE mg/dL
Hgb urine dipstick: NEGATIVE
Ketones, ur: NEGATIVE mg/dL
Nitrite: POSITIVE — AB
Specific Gravity, Urine: 1.015 (ref 1.005–1.030)
pH: 7.5 (ref 5.0–8.0)

## 2012-04-27 LAB — URINE MICROSCOPIC-ADD ON

## 2012-04-27 LAB — WET PREP, GENITAL: Trich, Wet Prep: NONE SEEN

## 2012-04-27 MED ORDER — NITROFURANTOIN MONOHYD MACRO 100 MG PO CAPS: 100.0000 mg | ORAL_CAPSULE | Freq: Two times a day (BID) | ORAL | Status: DC

## 2012-04-27 MED ORDER — ALBUTEROL SULFATE HFA 108 (90 BASE) MCG/ACT IN AERS: 1.0000 | INHALATION_SPRAY | Freq: Four times a day (QID) | RESPIRATORY_TRACT | Status: DC | PRN

## 2012-04-27 NOTE — MAU Provider Note (Signed)
History     CSN: [id number]  Arrival date and time: 04/27/12 1453   First Provider Initiated Contact with Patient 04/27/12 1622      Chief Complaint  Patient presents with  . Abdominal Pain   HPI Christina Cunningham is 31 y.o. G0P0000 presents with left sided abdominal pain, describes as sharp for 3 days.  She reports strong smelling urine.  Denies frequency or dysuria.  Sexually partner X 1 for 1 month.  No concerns for STD but rather be safe than sorry.  Had trichomonas 2 months ago.  Denies vaginal discharge or odor     Past Medical History  Diagnosis Date  . No pertinent past medical history     Past Surgical History  Procedure Date  . Stomach surgery     History reviewed. No pertinent family history.  History  Substance Use Topics  . Smoking status: Current Every Day Smoker -- 0.5 packs/day    Types: Cigarettes  . Smokeless tobacco: Not on file  . Alcohol Use: No    Allergies: No Known Allergies  Prescriptions prior to admission  Medication Sig Dispense Refill  . ENSURE (ENSURE) Take 1 Can by mouth daily.      Marland Kitchen ibuprofen (ADVIL,MOTRIN) 800 MG tablet Take 800 mg by mouth every 8 (eight) hours as needed. pain      . naproxen sodium (ANAPROX) 220 MG tablet Take 440 mg by mouth daily as needed. pain      . albuterol (PROVENTIL HFA;VENTOLIN HFA) 108 (90 BASE) MCG/ACT inhaler Inhale 2 puffs into the lungs every 6 (six) hours as needed. SOB        Review of Systems  Constitutional: Negative.   Gastrointestinal: Positive for abdominal pain (lower mid abdominal pain.).  Genitourinary: Negative for dysuria and frequency.       + urinary odor.  Neg for vaginal discharge or odor   Physical Exam   Blood pressure 108/50, pulse 79, temperature 98.9 F (37.2 C), temperature source Oral, resp. rate 18, height 5\' 3"  (1.6 m), weight 116 lb 9.6 oz (52.889 kg), last menstrual period 04/02/2012.  Physical Exam  Constitutional: She is oriented to person, place, and time. She  appears well-developed and well-nourished. No distress.  HENT:  Head: Normocephalic.  Neck: Normal range of motion.  Cardiovascular: Normal rate.   Respiratory: Effort normal.  GI: Soft. She exhibits no distension and no mass. There is no tenderness. There is no rebound and no guarding.  Genitourinary: There is no tenderness or lesion on the right labia. There is no tenderness or lesion on the left labia. Uterus is not enlarged and not tender. Cervix exhibits no discharge and no friability. Right adnexum displays no mass, no tenderness and no fullness. Left adnexum displays no mass, no tenderness and no fullness. No tenderness or bleeding around the vagina. Foreign body: white without odor. Vaginal discharge found.       Suprapubic tenderness on exam  Neurological: She is alert and oriented to person, place, and time.  Skin: Skin is warm and dry.  Psychiatric: She has a normal mood and affect. Her behavior is normal.   Results for orders placed during the hospital encounter of 04/27/12 (from the past 24 hour(s))  URINALYSIS, ROUTINE W REFLEX MICROSCOPIC     Status: Abnormal   Collection Time   04/27/12  3:30 PM      Component Value Range   Color, Urine YELLOW  YELLOW   APPearance CLOUDY (*) CLEAR   Specific  Gravity, Urine 1.015  1.005 - 1.030   pH 7.5  5.0 - 8.0   Glucose, UA NEGATIVE  NEGATIVE mg/dL   Hgb urine dipstick NEGATIVE  NEGATIVE   Bilirubin Urine NEGATIVE  NEGATIVE   Ketones, ur NEGATIVE  NEGATIVE mg/dL   Protein, ur NEGATIVE  NEGATIVE mg/dL   Urobilinogen, UA 0.2  0.0 - 1.0 mg/dL   Nitrite POSITIVE (*) NEGATIVE   Leukocytes, UA NEGATIVE  NEGATIVE  URINE MICROSCOPIC-ADD ON     Status: Abnormal   Collection Time   04/27/12  3:30 PM      Component Value Range   Squamous Epithelial / LPF FEW (*) RARE   WBC, UA 3-6  <3 WBC/hpf   Bacteria, UA MANY (*) RARE  POCT PREGNANCY, URINE     Status: Normal   Collection Time   04/27/12  3:42 PM      Component Value Range   Preg  Test, Ur NEGATIVE  NEGATIVE  WET PREP, GENITAL     Status: Abnormal   Collection Time   04/27/12  4:30 PM      Component Value Range   Yeast Wet Prep HPF POC NONE SEEN  NONE SEEN   Trich, Wet Prep NONE SEEN  NONE SEEN   Clue Cells Wet Prep HPF POC FEW (*) NONE SEEN   WBC, Wet Prep HPF POC FEW (*) NONE SEEN   MAU Course  Procedures  GC/CHL culture to lab  MDM  At time of discharge, patient asked for refill for inhaler.  Was initially given at [facility] for wheezing.  Need refill.  Will send refill to pharmacy  Assessment and Plan  A:  Urinary Tract Infection   P:  Rx for Macrobid Caps 1 po bid X 1 week Increase po fluids.  Encourage to complete medication Rx for inhaler to pharmacy  Janet Humphreys,EVE M 04/27/2012, 4:52 PM

## 2012-04-27 NOTE — MAU Note (Signed)
Pt c/o LLQ pain for the past 3 days. Also c/o breast pain for 1 week

## 2012-04-28 LAB — GC/CHLAMYDIA PROBE AMP
CT Probe RNA: NEGATIVE
GC Probe RNA: NEGATIVE

## 2012-04-30 LAB — URINE CULTURE

## 2012-12-13 ENCOUNTER — Inpatient Hospital Stay (HOSPITAL_COMMUNITY)
Admission: AD | Admit: 2012-12-13 | Discharge: 2012-12-13 | Disposition: A | Discharge status: Home / Self Care | Payer: Medicaid Other | Source: Ambulatory Visit | Attending: Obstetrics & Gynecology | Admitting: Obstetrics & Gynecology

## 2012-12-13 ENCOUNTER — Encounter (HOSPITAL_COMMUNITY): Payer: Self-pay | Admitting: *Deleted

## 2012-12-13 DIAGNOSIS — R109 Unspecified abdominal pain: Secondary | ICD-10-CM | POA: Insufficient documentation

## 2012-12-13 DIAGNOSIS — B9689 Other specified bacterial agents as the cause of diseases classified elsewhere: Secondary | ICD-10-CM | POA: Insufficient documentation

## 2012-12-13 DIAGNOSIS — A499 Bacterial infection, unspecified: Secondary | ICD-10-CM | POA: Insufficient documentation

## 2012-12-13 DIAGNOSIS — N760 Acute vaginitis: Secondary | ICD-10-CM | POA: Insufficient documentation

## 2012-12-13 DIAGNOSIS — N390 Urinary tract infection, site not specified: Secondary | ICD-10-CM

## 2012-12-13 DIAGNOSIS — R112 Nausea with vomiting, unspecified: Secondary | ICD-10-CM

## 2012-12-13 LAB — URINALYSIS, ROUTINE W REFLEX MICROSCOPIC
Bilirubin Urine: NEGATIVE
Ketones, ur: 15 mg/dL — AB
Nitrite: POSITIVE — AB
Protein, ur: >300 mg/dL — AB
pH: 8 (ref 5.0–8.0)

## 2012-12-13 LAB — CBC
HCT: 34.9 % — ABNORMAL LOW (ref 36.0–46.0)
Hemoglobin: 11.8 g/dL — ABNORMAL LOW (ref 12.0–15.0)
MCH: 28 pg (ref 26.0–34.0)
MCHC: 33.8 g/dL (ref 30.0–36.0)
RDW: 13.3 % (ref 11.5–15.5)

## 2012-12-13 LAB — URINE MICROSCOPIC-ADD ON

## 2012-12-13 LAB — WET PREP, GENITAL: Yeast Wet Prep HPF POC: NONE SEEN

## 2012-12-13 MED ORDER — ONDANSETRON HCL 4 MG PO TABS: 4.0000 mg | ORAL_TABLET | Freq: Three times a day (TID) | ORAL | Status: DC | PRN

## 2012-12-13 MED ORDER — ONDANSETRON 8 MG/NS 50 ML IVPB
8.0000 mg | Freq: Once | INTRAVENOUS | Status: AC
  Administered 2012-12-13: 8 mg via INTRAVENOUS
  Filled 2012-12-13: qty 8

## 2012-12-13 MED ORDER — SULFAMETHOXAZOLE-TMP DS 800-160 MG PO TABS: 1.0000 | ORAL_TABLET | Freq: Two times a day (BID) | ORAL | Status: DC

## 2012-12-13 MED ORDER — KETOROLAC TROMETHAMINE 60 MG/2ML IM SOLN: 60.0000 mg | Freq: Once | INTRAMUSCULAR | Status: DC

## 2012-12-13 MED ORDER — KETOROLAC TROMETHAMINE 60 MG/2ML IM SOLN
60.0000 mg | Freq: Once | INTRAMUSCULAR | Status: AC
  Administered 2012-12-13: 60 mg via INTRAMUSCULAR
  Filled 2012-12-13: qty 2

## 2012-12-13 MED ORDER — PROMETHAZINE HCL 25 MG/ML IJ SOLN
12.5000 mg | Freq: Once | INTRAMUSCULAR | Status: AC
  Administered 2012-12-13: 12.5 mg via INTRAVENOUS
  Filled 2012-12-13: qty 1

## 2012-12-13 MED ORDER — METRONIDAZOLE 500 MG PO TABS: 500.0000 mg | ORAL_TABLET | Freq: Two times a day (BID) | ORAL | Status: DC

## 2012-12-13 MED ORDER — LACTATED RINGERS IV SOLN
INTRAVENOUS | Status: DC
  Administered 2012-12-13: 14:00:00 via INTRAVENOUS

## 2012-12-13 NOTE — MAU Note (Signed)
Pt reports her period started yesterday. Reports abd pain and chills started this morning. Tried taking hydrocodone for pain but she threw that up. Does not think she si pregnant hadshad regular peirods but not on birth control.

## 2012-12-13 NOTE — MAU Note (Signed)
Pt states she had a period 3 times last month. She always states that she has nausea and vomiting every time she has her period. Has had hydrocodone from a previous visit and took it for her abdominal pain this morning and it has not helped.

## 2012-12-13 NOTE — MAU Provider Note (Signed)
History     CSN: [id number]  Arrival date and time: 12/13/12 1154   First Provider Initiated Contact with Patient 12/13/12 1359      Chief Complaint  Patient presents with  . Abdominal Pain   HPI Ms. Christina S Troxleris a 32 y.o.non-pregnant female, G0 who presents to MAU with complaints of bilateral lower abdominal pain. LMP 12/12/12; period was light yesterday and became heavy today. She began vomiting this morning and has not been able to keep food and liquids down today. She normally has N/V with her menstrual cycle, however today it seemed worse.    OB History   Grav Para Term Preterm Abortions TAB SAB Ect Mult Living   0 0 0 0 0 0 0 0 0 0       Past Medical History  Diagnosis Date  . No pertinent past medical history     Past Surgical History  Procedure Laterality Date  . Stomach surgery      History reviewed. No pertinent family history.  History  Substance Use Topics  . Smoking status: Current Every Day Smoker -- 0.50 packs/day    Types: Cigarettes  . Smokeless tobacco: Not on file  . Alcohol Use: No    Allergies: No Known Allergies  Prescriptions prior to admission  Medication Sig Dispense Refill  . ENSURE (ENSURE) Take 1 Can by mouth daily.      Marland Kitchen HYDROcodone-acetaminophen (NORCO/VICODIN) 5-325 MG per tablet Take 1 tablet by mouth every 6 (six) hours as needed for pain.      Marland Kitchen albuterol (PROVENTIL HFA;VENTOLIN HFA) 108 (90 BASE) MCG/ACT inhaler Inhale 2 puffs into the lungs every 6 (six) hours as needed. SOB       Results for orders placed during the hospital encounter of 12/13/12 (from the past 24 hour(s))  URINALYSIS, ROUTINE W REFLEX MICROSCOPIC     Status: Abnormal   Collection Time    12/13/12 12:39 PM      Result Value Range   Color, Urine RED (*) YELLOW   APPearance TURBID (*) CLEAR   Specific Gravity, Urine 1.020  1.005 - 1.030   pH 8.0  5.0 - 8.0   Glucose, UA NEGATIVE  NEGATIVE mg/dL   Hgb urine dipstick LARGE (*) NEGATIVE   Bilirubin  Urine NEGATIVE  NEGATIVE   Ketones, ur 15 (*) NEGATIVE mg/dL   Protein, ur >782 (*) NEGATIVE mg/dL   Urobilinogen, UA 2.0 (*) 0.0 - 1.0 mg/dL   Nitrite POSITIVE (*) NEGATIVE   Leukocytes, UA SMALL (*) NEGATIVE  URINE MICROSCOPIC-ADD ON     Status: Abnormal   Collection Time    12/13/12 12:39 PM      Result Value Range   Squamous Epithelial / LPF FEW (*) RARE   WBC, UA 7-10  <3 WBC/hpf   RBC / HPF TOO NUMEROUS TO COUNT  <3 RBC/hpf   Bacteria, UA MANY (*) RARE  POCT PREGNANCY, URINE     Status: None   Collection Time    12/13/12 12:43 PM      Result Value Range   Preg Test, Ur NEGATIVE  NEGATIVE  CBC     Status: Abnormal   Collection Time    12/13/12  3:08 PM      Result Value Range   WBC 8.4  4.0 - 10.5 K/uL   RBC 4.21  3.87 - 5.11 MIL/uL   Hemoglobin 11.8 (*) 12.0 - 15.0 g/dL   HCT 95.6 (*) 21.3 - 08.6 %   MCV  82.9  78.0 - 100.0 fL   MCH 28.0  26.0 - 34.0 pg   MCHC 33.8  30.0 - 36.0 g/dL   RDW 16.1  09.6 - 04.5 %   Platelets 190  150 - 400 K/uL  WET PREP, GENITAL     Status: Abnormal   Collection Time    12/13/12  3:40 PM      Result Value Range   Yeast Wet Prep HPF POC NONE SEEN  NONE SEEN   Trich, Wet Prep NONE SEEN  NONE SEEN   Clue Cells Wet Prep HPF POC MODERATE (*) NONE SEEN   WBC, Wet Prep HPF POC FEW (*) NONE SEEN    Review of Systems  Constitutional: Negative for fever and chills.  Eyes: Negative for blurred vision.  Gastrointestinal: Positive for nausea, vomiting and abdominal pain.       Bilateral lower abdominal pain  Patient actively vomiting in MAU  Genitourinary: Negative for dysuria, urgency and frequency.       Started menstrual cycle 12/12/2012  Neurological: Negative for dizziness.   Physical Exam   Blood pressure 108/67, pulse 66, temperature 97.8 F (36.6 C), temperature source Oral, resp. rate 18, height 5' 3.5" (1.613 m), weight 53.615 kg (118 lb 3.2 oz), last menstrual period 12/12/2012.  Physical Exam  Constitutional: She is oriented  to person, place, and time. She appears well-developed and well-nourished. No distress.  GI: There is CVA tenderness.  Bilateral, mild CVA tenderness  Genitourinary:  Speculum exam: Vagina - moderate amount of bright red bleeding, no odor Cervix - Mild/moderate bleeding Bimanual exam: Cervix closed Uterus with mild tenderness, normal size Adnexa non tender, no masses bilaterally GC/Chlam, wet prep done Chaperone present for exam.   Neurological: She is alert and oriented to person, place, and time.  Skin: Skin is warm.    MAU Course  Procedures  MDM Maintain IV access LR bolus  Phenergan 12.5 IV Toradol 60 mg IM  Zofran 8 mg IVP CBC GC/Chlamydia pending Urine culture pending Patient tolerated PO fluids and crackers. Rates pain 6/10   Assessment and Plan  A: Urinary Tract infection Nausea and Vomiting  Bacterial vaginosis    P: Zofran 4 mg Q8 hours PRN for nausea/vomitting, take prior to Bactrim as needed Bactrim 160/800 mg PO BID for 3 days Flagyl 500 mg PO BID for 7 days If unable to keep antibiotic down return to MAU Ok to take Ibuprofen for abdominal pain/menstrual cramping    Kailin Leu IRENE FNP-C 12/13/2012 4:08 PM   12/13/2012, 1:59 PM

## 2012-12-14 LAB — URINE CULTURE: Colony Count: 40000

## 2012-12-15 LAB — GC/CHLAMYDIA PROBE AMP
CT Probe RNA: NEGATIVE
GC Probe RNA: NEGATIVE

## 2013-02-20 ENCOUNTER — Inpatient Hospital Stay (HOSPITAL_COMMUNITY)
Admission: AD | Admit: 2013-02-20 | Discharge: 2013-02-20 | Disposition: A | Discharge status: Home / Self Care | Payer: Self-pay | Source: Ambulatory Visit | Attending: Obstetrics & Gynecology | Admitting: Obstetrics & Gynecology

## 2013-02-20 ENCOUNTER — Encounter (HOSPITAL_COMMUNITY): Payer: Self-pay | Admitting: *Deleted

## 2013-02-20 DIAGNOSIS — B373 Candidiasis of vulva and vagina: Secondary | ICD-10-CM

## 2013-02-20 DIAGNOSIS — B9689 Other specified bacterial agents as the cause of diseases classified elsewhere: Secondary | ICD-10-CM | POA: Insufficient documentation

## 2013-02-20 DIAGNOSIS — R350 Frequency of micturition: Secondary | ICD-10-CM | POA: Insufficient documentation

## 2013-02-20 DIAGNOSIS — B3731 Acute candidiasis of vulva and vagina: Secondary | ICD-10-CM | POA: Insufficient documentation

## 2013-02-20 DIAGNOSIS — A499 Bacterial infection, unspecified: Secondary | ICD-10-CM | POA: Insufficient documentation

## 2013-02-20 DIAGNOSIS — N760 Acute vaginitis: Secondary | ICD-10-CM | POA: Insufficient documentation

## 2013-02-20 HISTORY — DX: Gonococcal infection, unspecified: A54.9

## 2013-02-20 HISTORY — DX: Trichomoniasis, unspecified: A59.9

## 2013-02-20 LAB — URINALYSIS, ROUTINE W REFLEX MICROSCOPIC
Ketones, ur: 15 mg/dL — AB
Leukocytes, UA: NEGATIVE
Nitrite: NEGATIVE
Specific Gravity, Urine: 1.025 (ref 1.005–1.030)
pH: 6 (ref 5.0–8.0)

## 2013-02-20 LAB — WET PREP, GENITAL: Trich, Wet Prep: NONE SEEN

## 2013-02-20 MED ORDER — METRONIDAZOLE 500 MG PO TABS: 500.0000 mg | ORAL_TABLET | Freq: Two times a day (BID) | ORAL | Status: DC

## 2013-02-20 MED ORDER — FLUCONAZOLE 150 MG PO TABS
150.0000 mg | ORAL_TABLET | Freq: Once | ORAL | Status: AC
  Administered 2013-02-20: 150 mg via ORAL
  Filled 2013-02-20: qty 1

## 2013-02-20 MED ORDER — FLUCONAZOLE 150 MG PO TABS: ORAL_TABLET | ORAL | Status: DC

## 2013-02-20 NOTE — MAU Note (Signed)
Patient complains of frequency but no pain with urination.

## 2013-02-20 NOTE — MAU Provider Note (Signed)
History     CSN: [id number]  Arrival date and time: 02/20/13 2251   First Provider Initiated Contact with Patient 02/20/13 2310      Chief Complaint  Patient presents with  . Urinary Tract Infection   HPI Ms. Christina Cunningham is a 32 y.o. G0P0000 who presents to MAU today with complaint of urinary frequency and urgency and vaginal discharge x 3 days. The patient denies dysuria, hematuria, pelvic pain, vaginal bleeding or flank pain. She states that the discharge is thick and white without itching or irritation.   OB History   Grav Para Term Preterm Abortions TAB SAB Ect Mult Living   0 0 0 0 0 0 0 0 0 0       Past Medical History  Diagnosis Date  . Trichimoniasis   . Gonorrhea     Past Surgical History  Procedure Laterality Date  . Stomach surgery      History reviewed. No pertinent family history.  History  Substance Use Topics  . Smoking status: Current Every Day Smoker -- 0.50 packs/day    Types: Cigarettes  . Smokeless tobacco: Not on file  . Alcohol Use: No    Allergies: No Known Allergies  Prescriptions prior to admission  Medication Sig Dispense Refill  . ENSURE (ENSURE) Take 1 Can by mouth daily.      Marland Kitchen HYDROcodone-acetaminophen (NORCO/VICODIN) 5-325 MG per tablet Take 1 tablet by mouth every 6 (six) hours as needed for pain.      Marland Kitchen albuterol (PROVENTIL HFA;VENTOLIN HFA) 108 (90 BASE) MCG/ACT inhaler Inhale 2 puffs into the lungs every 6 (six) hours as needed. SOB      . ondansetron (ZOFRAN) 4 MG tablet Take 1 tablet (4 mg total) by mouth every 8 (eight) hours as needed for nausea.  20 tablet  0  . sulfamethoxazole-trimethoprim (BACTRIM DS) 800-160 MG per tablet Take 1 tablet by mouth 2 (two) times daily.  6 tablet  0  . [DISCONTINUED] metroNIDAZOLE (FLAGYL) 500 MG tablet Take 1 tablet (500 mg total) by mouth 2 (two) times daily.  14 tablet  0    Review of Systems  Constitutional: Negative for fever and malaise/fatigue.  Gastrointestinal: Negative for  nausea, vomiting and abdominal pain.  Genitourinary: Positive for urgency and frequency. Negative for dysuria, hematuria and flank pain.       + vaginal discharge Neg - vaginal bleeding   Physical Exam   Blood pressure 114/60, pulse 78, temperature 98.1 F (36.7 C), temperature source Oral, resp. rate 18, height 5\' 2"  (1.575 m), weight 121 lb (54.885 kg), last menstrual period 02/04/2013.  Physical Exam  Constitutional: She is oriented to person, place, and time. She appears well-developed and well-nourished. No distress.  HENT:  Head: Normocephalic and atraumatic.  Cardiovascular: Normal rate.   Respiratory: Effort normal.  GI: Soft. She exhibits no distension and no mass. There is no tenderness. There is no rebound and no guarding.  Genitourinary: Uterus is not enlarged and not tender. Cervix exhibits no motion tenderness, no discharge and no friability. Right adnexum displays no mass and no tenderness. Left adnexum displays no mass and no tenderness. No bleeding around the vagina. Vaginal discharge (moderate amount of thin, white discharge noted with few clumps) found.  Neurological: She is alert and oriented to person, place, and time.  Skin: Skin is warm and dry. No erythema.  Psychiatric: She has a normal mood and affect.   Results for orders placed during the hospital encounter of 02/20/13 (from  the past 24 hour(s))  URINALYSIS, ROUTINE W REFLEX MICROSCOPIC     Status: Abnormal   Collection Time    02/20/13 10:54 PM      Result Value Range   Color, Urine YELLOW  YELLOW   APPearance CLEAR  CLEAR   Specific Gravity, Urine 1.025  1.005 - 1.030   pH 6.0  5.0 - 8.0   Glucose, UA NEGATIVE  NEGATIVE mg/dL   Hgb urine dipstick NEGATIVE  NEGATIVE   Bilirubin Urine NEGATIVE  NEGATIVE   Ketones, ur 15 (*) NEGATIVE mg/dL   Protein, ur NEGATIVE  NEGATIVE mg/dL   Urobilinogen, UA 0.2  0.0 - 1.0 mg/dL   Nitrite NEGATIVE  NEGATIVE   Leukocytes, UA NEGATIVE  NEGATIVE  POCT PREGNANCY,  URINE     Status: None   Collection Time    02/20/13 11:01 PM      Result Value Range   Preg Test, Ur NEGATIVE  NEGATIVE  WET PREP, GENITAL     Status: Abnormal   Collection Time    02/20/13 11:15 PM      Result Value Range   Yeast Wet Prep HPF POC FEW (*) NONE SEEN   Trich, Wet Prep NONE SEEN  NONE SEEN   Clue Cells Wet Prep HPF POC FEW (*) NONE SEEN   WBC, Wet Prep HPF POC FEW (*) NONE SEEN    MAU Course  Procedures None  MDM UPT - negative UA, Wet prep and GC/Chlamydia today  Assessment and Plan  A: Bacterial vaginosis Yeast vulvovaginitis  P: Discharge home Rx for Diflucan and Flagyl sent to patient's pharmacy 150 mg Diflucan given in MAU today Discussed probiotics and hygiene products for avoiding recurrent BV Patient may return to MAU as needed or if her condition were to change or worsen   Freddi Starr, PA-C  02/20/2013, 11:47 PM

## 2013-02-21 LAB — GC/CHLAMYDIA PROBE AMP: GC Probe RNA: NEGATIVE

## 2013-06-28 ENCOUNTER — Encounter (HOSPITAL_COMMUNITY): Payer: Self-pay

## 2013-06-28 ENCOUNTER — Inpatient Hospital Stay (HOSPITAL_COMMUNITY)
Admission: AD | Admit: 2013-06-28 | Discharge: 2013-06-28 | Disposition: A | Discharge status: Home / Self Care | Payer: Medicaid Other | Source: Ambulatory Visit | Attending: Obstetrics & Gynecology | Admitting: Obstetrics & Gynecology

## 2013-06-28 DIAGNOSIS — N760 Acute vaginitis: Secondary | ICD-10-CM | POA: Insufficient documentation

## 2013-06-28 DIAGNOSIS — N946 Dysmenorrhea, unspecified: Secondary | ICD-10-CM

## 2013-06-28 DIAGNOSIS — R109 Unspecified abdominal pain: Secondary | ICD-10-CM | POA: Insufficient documentation

## 2013-06-28 DIAGNOSIS — B9689 Other specified bacterial agents as the cause of diseases classified elsewhere: Secondary | ICD-10-CM | POA: Insufficient documentation

## 2013-06-28 DIAGNOSIS — A499 Bacterial infection, unspecified: Secondary | ICD-10-CM | POA: Insufficient documentation

## 2013-06-28 DIAGNOSIS — F17200 Nicotine dependence, unspecified, uncomplicated: Secondary | ICD-10-CM | POA: Insufficient documentation

## 2013-06-28 LAB — URINALYSIS, ROUTINE W REFLEX MICROSCOPIC
GLUCOSE, UA: NEGATIVE mg/dL
KETONES UR: 15 mg/dL — AB
Leukocytes, UA: NEGATIVE
Nitrite: NEGATIVE
PROTEIN: 30 mg/dL — AB
Specific Gravity, Urine: >1.03 — ABNORMAL HIGH (ref 1.005–1.030)
UROBILINOGEN UA: 4 mg/dL — AB (ref 0.0–1.0)
pH: 6 (ref 5.0–8.0)

## 2013-06-28 LAB — WET PREP, GENITAL
TRICH WET PREP: NONE SEEN
YEAST WET PREP: NONE SEEN

## 2013-06-28 LAB — POCT PREGNANCY, URINE: Preg Test, Ur: NEGATIVE

## 2013-06-28 LAB — URINE MICROSCOPIC-ADD ON

## 2013-06-28 MED ORDER — IBUPROFEN 800 MG PO TABS
800.0000 mg | ORAL_TABLET | Freq: Once | ORAL | Status: AC
  Administered 2013-06-28: 800 mg via ORAL
  Filled 2013-06-28: qty 1

## 2013-06-28 MED ORDER — METRONIDAZOLE 500 MG PO TABS: 500.0000 mg | ORAL_TABLET | Freq: Two times a day (BID) | ORAL | Status: DC

## 2013-06-28 MED ORDER — IBUPROFEN 800 MG PO TABS: 800.0000 mg | ORAL_TABLET | Freq: Once | ORAL | Status: DC

## 2013-06-28 NOTE — Discharge Instructions (Signed)
Bacterial Vaginosis Bacterial vaginosis is a vaginal infection that occurs when the normal balance of bacteria in the vagina is disrupted. It results from an overgrowth of certain bacteria. This is the most common vaginal infection in women of childbearing age. Treatment is important to prevent complications, especially in pregnant women, as it can cause a premature delivery. CAUSES  Bacterial vaginosis is caused by an increase in harmful bacteria that are normally present in smaller amounts in the vagina. Several different kinds of bacteria can cause bacterial vaginosis. However, the reason that the condition develops is not fully understood. RISK FACTORS Certain activities or behaviors can put you at an increased risk of developing bacterial vaginosis, including:  Having a new sex partner or multiple sex partners.  Douching.  Using an intrauterine device (IUD) for contraception. Women do not get bacterial vaginosis from toilet seats, bedding, swimming pools, or contact with objects around them. SIGNS AND SYMPTOMS  Some women with bacterial vaginosis have no signs or symptoms. Common symptoms include:  Grey vaginal discharge.  A fishlike odor with discharge, especially after sexual intercourse.  Itching or burning of the vagina and vulva.  Burning or pain with urination. DIAGNOSIS  Your health care provider will take a medical history and examine the vagina for signs of bacterial vaginosis. A sample of vaginal fluid may be taken. Your health care provider will look at this sample under a microscope to check for bacteria and abnormal cells. A vaginal pH test may also be done.  TREATMENT  Bacterial vaginosis may be treated with antibiotic medicines. These may be given in the form of a pill or a vaginal cream. A second round of antibiotics may be prescribed if the condition comes back after treatment.  HOME CARE INSTRUCTIONS   Only take over-the-counter or prescription medicines as  directed by your health care provider.  If antibiotic medicine was prescribed, take it as directed. Make sure you finish it even if you start to feel better.  Do not have sex until treatment is completed.  Tell all sexual partners that you have a vaginal infection. They should see their health care provider and be treated if they have problems, such as a mild rash or itching.  Practice safe sex by using condoms and only having one sex partner. SEEK MEDICAL CARE IF:   Your symptoms are not improving after 3 days of treatment.  You have increased discharge or pain.  You have a fever. MAKE SURE YOU:   Understand these instructions.  Will watch your condition.  Will get help right away if you are not doing well or get worse. FOR MORE INFORMATION  Centers for Disease Control and Prevention, Division of STD Prevention: [employer]raud[employer] American Sexual Health Association (ASHA): www.ashastd.org  Document Released: 04/16/2005 Document Revised: 02/04/2013 Document Reviewed: 11/26/2012 [facility] Patient Information 2014 [place].  Dysmenorrhea Menstrual cramps (dysmenorrhea) are caused by the muscles of the uterus tightening (contracting) during a menstrual period. For some women, this discomfort is merely bothersome. For others, dysmenorrhea can be severe enough to interfere with everyday activities for a few days each month. Primary dysmenorrhea is menstrual cramps that last a couple of days when you start having menstrual periods or soon after. This often begins after a teenager starts having her period. As a woman gets older or has a baby, the cramps will usually lessen or disappear. Secondary dysmenorrhea begins later in life, lasts longer, and the pain may be stronger than primary dysmenorrhea. The pain may start before the  period and last a few days after the period.  CAUSES  Dysmenorrhea is usually caused by an underlying problem, such as:  The tissue lining the uterus grows  outside of the uterus in other areas of the body (endometriosis).  The endometrial tissue, which normally lines the uterus, is found in or grows into the muscular walls of the uterus (adenomyosis).  The pelvic blood vessels are engorged with blood just before the menstrual period (pelvic congestive syndrome).  Overgrowth of cells (polyps) in the lining of the uterus or cervix.  Falling down of the uterus (prolapse) because of loose or stretched ligaments.  Depression.  Bladder problems, infection, or inflammation.  Problems with the intestine, a tumor, or irritable bowel syndrome.  A severely tipped uterus.  A very tight opening or closed cervix.  Noncancerous tumors of the uterus (fibroids).  Pelvic inflammatory disease (PID).  Pelvic scarring (adhesions) from a previous surgery.  Ovarian cyst.  An intrauterine device (IUD) used for birth control. RISK FACTORS You may be at greater risk of dysmenorrhea if:  You are younger than age 41.  You started puberty early.  You have irregular or heavy bleeding.  You have never given birth.  You have a family history of this problem.  You are a smoker. SIGNS AND SYMPTOMS   Cramping or throbbing pain in your lower abdomen.  Headaches.  Lower back pain.  Nausea or vomiting.  Diarrhea.  Sweating or dizziness.  Loose stools. DIAGNOSIS  A diagnosis is based on your history, symptoms, physical exam, diagnostic tests, or procedures. Diagnostic tests or procedures may include:  Blood tests.  Ultrasonography.  An examination of the lining of the uterus (dilation and curettage, D&C).  An examination inside your abdomen or pelvis with a scope (laparoscopy).  X-rays.  CT scan.  MRI.  An examination inside the bladder with a scope (cystoscopy).  An examination inside the intestine or stomach with a scope (colonoscopy, gastroscopy). TREATMENT  Treatment depends on the cause of the dysmenorrhea. Treatment may  include:  Pain medicine prescribed by your health care provider.  Birth control pills or an IUD with progesterone hormone in it.  Hormone replacement therapy.  Nonsteroidal anti-inflammatory drugs (NSAIDs). These may help stop the production of prostaglandins.  Surgery to remove adhesions, endometriosis, ovarian cyst, or fibroids.  Removal of the uterus (hysterectomy).  Progesterone shots to stop the menstrual period.  Cutting the nerves on the sacrum that go to the female organs (presacral neurectomy).  Electric current to the sacral nerves (sacral nerve stimulation).  Antidepressant medicine.  Psychiatric therapy, counseling, or group therapy.  Exercise and physical therapy.  Meditation and yoga therapy.  Acupuncture. HOME CARE INSTRUCTIONS   Only take over-the-counter or prescription medicines as directed by your health care provider.  Place a heating pad or hot water bottle on your lower back or abdomen. Do not sleep with the heating pad.  Use aerobic exercises, walking, swimming, biking, and other exercises to help lessen the cramping.  Massage to the lower back or abdomen may help.  Stop smoking.  Avoid alcohol and caffeine. SEEK MEDICAL CARE IF:   Your pain does not get better with medicine.  You have pain with sexual intercourse.  Your pain increases and is not controlled with medicines.  You have abnormal vaginal bleeding with your period.  You develop nausea or vomiting with your period that is not controlled with medicine. SEEK IMMEDIATE MEDICAL CARE IF:  You pass out.  Document Released: 04/16/2005 Document Revised:  12/17/2012 Document Reviewed: 10/02/2012 [employer] Patient Information 2014 [place], [place].

## 2013-06-28 NOTE — MAU Provider Note (Signed)
Attestation of Attending Supervision of Advanced Practitioner (PA/CNM/NP): Evaluation and management procedures were performed by the Advanced Practitioner under my supervision and collaboration.  I have reviewed the Advanced Practitioner's note and chart, and I agree with the management and plan.  Mystique Bjelland, MD, FACOG Attending Obstetrician & Gynecologist Faculty Practice, [facility]  

## 2013-06-28 NOTE — MAU Note (Signed)
Pt states started period today and has had lower abdominal cramping 10/10. States this happen every time she has a period & wants to know what's wrong with her. Took doses of aleve & Midol with no relief. Denies vaginal discharge or dysuria.

## 2013-06-28 NOTE — MAU Provider Note (Signed)
History     CSN: [id number]  Arrival date and time: 06/28/13 6270   First Provider Initiated Contact with Patient 06/28/13 2023      Chief Complaint  Patient presents with  . Abdominal Pain   Abdominal Pain    Christina Cunningham is a 33 y.o. G0P0000 who presents today with cramping. She states that her period started yesterday, and she has had cramps today. She states that the second day of her period is when it is usually at it's worse. She states that she took some aleve and midol earlier today around 10am, but has not taken anything since then. She does not have a PCP or GYN, and she is not interested in taking any form of birth control at this time.   Past Medical History  Diagnosis Date  . Trichimoniasis   . Gonorrhea     Past Surgical History  Procedure Laterality Date  . Stomach surgery      History reviewed. No pertinent family history.  History  Substance Use Topics  . Smoking status: Current Every Day Smoker -- 0.50 packs/day    Types: Cigarettes  . Smokeless tobacco: Not on file  . Alcohol Use: No    Allergies: No Known Allergies  Prescriptions prior to admission  Medication Sig Dispense Refill  . ENSURE (ENSURE) Take 1 Can by mouth daily.      Marland Kitchen ibuprofen (ADVIL,MOTRIN) 200 MG tablet Take 200 mg by mouth every 6 (six) hours as needed.      . naproxen sodium (ANAPROX) 220 MG tablet Take 220 mg by mouth 2 (two) times daily with a meal.      . OVER THE COUNTER MEDICATION Apply 1 application topically daily. Cream used for Psoriasis      . albuterol (PROVENTIL HFA;VENTOLIN HFA) 108 (90 BASE) MCG/ACT inhaler Inhale 2 puffs into the lungs every 6 (six) hours as needed. SOB        Review of Systems  Gastrointestinal: Positive for abdominal pain.   Physical Exam   Blood pressure 112/66, pulse 68, temperature 98.5 F (36.9 C), temperature source Oral, resp. rate 18, height 5\' 4"  (1.626 m), weight 53.615 kg (118 lb 3.2 oz), last menstrual period 06/28/2013,  SpO2 100.00%.  Physical Exam  Nursing note and vitals reviewed. Constitutional: She is oriented to person, place, and time. She appears well-developed and well-nourished. No distress.  Cardiovascular: Normal rate.   Respiratory: Effort normal.  GI: There is no tenderness.  Genitourinary:   External: no lesion Vagina: small amount of blood seen Cervix: pink, smooth, no CMT Uterus: NSSC Adnexa: NT   Neurological: She is alert and oriented to person, place, and time.  Skin: Skin is warm and dry.  Psychiatric: She has a normal mood and affect.    MAU Course  Procedures  Results for orders placed during the hospital encounter of 06/28/13 (from the past 24 hour(s))  URINALYSIS, ROUTINE W REFLEX MICROSCOPIC     Status: Abnormal   Collection Time    06/28/13  7:27 PM      Result Value Ref Range   Color, Urine YELLOW  YELLOW   APPearance CLEAR  CLEAR   Specific Gravity, Urine >1.030 (*) 1.005 - 1.030   pH 6.0  5.0 - 8.0   Glucose, UA NEGATIVE  NEGATIVE mg/dL   Hgb urine dipstick LARGE (*) NEGATIVE   Bilirubin Urine SMALL (*) NEGATIVE   Ketones, ur 15 (*) NEGATIVE mg/dL   Protein, ur 30 (*) NEGATIVE mg/dL  Urobilinogen, UA 4.0 (*) 0.0 - 1.0 mg/dL   Nitrite NEGATIVE  NEGATIVE   Leukocytes, UA NEGATIVE  NEGATIVE  URINE MICROSCOPIC-ADD ON     Status: Abnormal   Collection Time    06/28/13  7:27 PM      Result Value Ref Range   Squamous Epithelial / LPF RARE  RARE   WBC, UA 0-2  <3 WBC/hpf   RBC / HPF 7-10  <3 RBC/hpf   Bacteria, UA RARE  RARE   Crystals CA OXALATE CRYSTALS (*) NEGATIVE   Urine-Other MUCOUS PRESENT    POCT PREGNANCY, URINE     Status: None   Collection Time    06/28/13  7:32 PM      Result Value Ref Range   Preg Test, Ur NEGATIVE  NEGATIVE  WET PREP, GENITAL     Status: Abnormal   Collection Time    06/28/13  8:30 PM      Result Value Ref Range   Yeast Wet Prep HPF POC NONE SEEN  NONE SEEN   Trich, Wet Prep NONE SEEN  NONE SEEN   Clue Cells Wet Prep  HPF POC MODERATE (*) NONE SEEN   WBC, Wet Prep HPF POC FEW (*) NONE SEEN     Assessment and Plan   1. BV (bacterial vaginosis)   2. Dysmenorrhea    Discussed comfort measures Discussed use of OCP or IUD to help with symptoms Patient is not interested in either of those at this time Will call HD if interesed Return to MAU as needed      Medication List         albuterol 108 (90 BASE) MCG/ACT inhaler  Commonly known as:  PROVENTIL HFA;VENTOLIN HFA  Inhale 2 puffs into the lungs every 6 (six) hours as needed. SOB     ENSURE  Take 1 Can by mouth daily.     ibuprofen 800 MG tablet  Commonly known as:  ADVIL,MOTRIN  Take 1 tablet (800 mg total) by mouth once.     ibuprofen 200 MG tablet  Commonly known as:  ADVIL,MOTRIN  Take 200 mg by mouth every 6 (six) hours as needed.     metroNIDAZOLE 500 MG tablet  Commonly known as:  FLAGYL  Take 1 tablet (500 mg total) by mouth 2 (two) times daily.     naproxen sodium 220 MG tablet  Commonly known as:  ANAPROX  Take 220 mg by mouth 2 (two) times daily with a meal.     OVER THE COUNTER MEDICATION  Apply 1 application topically daily. Cream used for Psoriasis       Follow-up Information   Follow up with [facility] [place]. (As needed)    Contact information:   [place] [address] [phone]       Mathis Bud 06/28/2013, 8:34 PM

## 2013-06-29 LAB — GC/CHLAMYDIA PROBE AMP
CT Probe RNA: NEGATIVE
GC Probe RNA: NEGATIVE

## 2013-10-19 ENCOUNTER — Encounter (HOSPITAL_COMMUNITY): Payer: Self-pay | Admitting: *Deleted

## 2013-10-19 ENCOUNTER — Inpatient Hospital Stay (HOSPITAL_COMMUNITY)
Admission: AD | Admit: 2013-10-19 | Discharge: 2013-10-19 | Disposition: A | Discharge status: Home / Self Care | Payer: Medicaid Other | Source: Ambulatory Visit | Attending: Obstetrics & Gynecology | Admitting: Obstetrics & Gynecology

## 2013-10-19 DIAGNOSIS — L738 Other specified follicular disorders: Secondary | ICD-10-CM

## 2013-10-19 DIAGNOSIS — L408 Other psoriasis: Secondary | ICD-10-CM | POA: Insufficient documentation

## 2013-10-19 DIAGNOSIS — L0233 Carbuncle of buttock: Secondary | ICD-10-CM | POA: Insufficient documentation

## 2013-10-19 DIAGNOSIS — F17200 Nicotine dependence, unspecified, uncomplicated: Secondary | ICD-10-CM | POA: Insufficient documentation

## 2013-10-19 DIAGNOSIS — L0232 Furuncle of buttock: Secondary | ICD-10-CM

## 2013-10-19 HISTORY — DX: Unspecified infectious disease: B99.9

## 2013-10-19 HISTORY — DX: Psoriasis, unspecified: L40.9

## 2013-10-19 HISTORY — DX: Unspecified ovarian cyst, unspecified side: N83.209

## 2013-10-19 LAB — URINALYSIS, ROUTINE W REFLEX MICROSCOPIC
BILIRUBIN URINE: NEGATIVE
GLUCOSE, UA: NEGATIVE mg/dL
HGB URINE DIPSTICK: NEGATIVE
Ketones, ur: NEGATIVE mg/dL
Leukocytes, UA: NEGATIVE
Nitrite: NEGATIVE
PH: 5.5 (ref 5.0–8.0)
Protein, ur: NEGATIVE mg/dL
SPECIFIC GRAVITY, URINE: 1.015 (ref 1.005–1.030)
UROBILINOGEN UA: 0.2 mg/dL (ref 0.0–1.0)

## 2013-10-19 LAB — POCT PREGNANCY, URINE: PREG TEST UR: NEGATIVE

## 2013-10-19 NOTE — MAU Note (Signed)
Patient states she first noticed a bump on the perineum and tried to pop it. Nothing came out and the bump has gotten bigger and is having a lot of pain.

## 2013-10-19 NOTE — Discharge Instructions (Signed)
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
## 2013-10-19 NOTE — MAU Provider Note (Signed)
Attestation of Attending Supervision of Advanced Practitioner (CNM/NP): Evaluation and management procedures were performed by the Advanced Practitioner under my supervision and collaboration. I have reviewed the Advanced Practitioner's note and chart, and I agree with the management and plan.  LEGGETT,KELLY H. 4:42 PM

## 2013-10-19 NOTE — MAU Note (Signed)
First noted early Sunday morning.  Messed with it and now it hurts worse.

## 2014-01-09 ENCOUNTER — Encounter (HOSPITAL_BASED_OUTPATIENT_CLINIC_OR_DEPARTMENT_OTHER): Payer: Self-pay | Admitting: Emergency Medicine

## 2014-01-09 ENCOUNTER — Emergency Department (HOSPITAL_BASED_OUTPATIENT_CLINIC_OR_DEPARTMENT_OTHER)
Admission: EM | Admit: 2014-01-09 | Discharge: 2014-01-09 | Disposition: A | Discharge status: Home / Self Care | Payer: Self-pay | Attending: Emergency Medicine | Admitting: Emergency Medicine

## 2014-01-09 DIAGNOSIS — S6990XA Unspecified injury of unspecified wrist, hand and finger(s), initial encounter: Secondary | ICD-10-CM | POA: Insufficient documentation

## 2014-01-09 DIAGNOSIS — Z8742 Personal history of other diseases of the female genital tract: Secondary | ICD-10-CM | POA: Insufficient documentation

## 2014-01-09 DIAGNOSIS — S199XXA Unspecified injury of neck, initial encounter: Principal | ICD-10-CM

## 2014-01-09 DIAGNOSIS — Y9389 Activity, other specified: Secondary | ICD-10-CM | POA: Insufficient documentation

## 2014-01-09 DIAGNOSIS — S6980XA Other specified injuries of unspecified wrist, hand and finger(s), initial encounter: Secondary | ICD-10-CM | POA: Insufficient documentation

## 2014-01-09 DIAGNOSIS — S0993XA Unspecified injury of face, initial encounter: Secondary | ICD-10-CM | POA: Insufficient documentation

## 2014-01-09 DIAGNOSIS — F17200 Nicotine dependence, unspecified, uncomplicated: Secondary | ICD-10-CM | POA: Insufficient documentation

## 2014-01-09 DIAGNOSIS — Y92410 Unspecified street and highway as the place of occurrence of the external cause: Secondary | ICD-10-CM | POA: Insufficient documentation

## 2014-01-09 DIAGNOSIS — Z8619 Personal history of other infectious and parasitic diseases: Secondary | ICD-10-CM | POA: Insufficient documentation

## 2014-01-09 DIAGNOSIS — Z872 Personal history of diseases of the skin and subcutaneous tissue: Secondary | ICD-10-CM | POA: Insufficient documentation

## 2014-01-09 DIAGNOSIS — IMO0002 Reserved for concepts with insufficient information to code with codable children: Secondary | ICD-10-CM | POA: Insufficient documentation

## 2014-01-09 DIAGNOSIS — Z79899 Other long term (current) drug therapy: Secondary | ICD-10-CM | POA: Insufficient documentation

## 2014-01-09 MED ORDER — HYDROCODONE-ACETAMINOPHEN 5-325 MG PO TABS: 1.0000 | ORAL_TABLET | Freq: Four times a day (QID) | ORAL | Status: DC | PRN

## 2014-01-09 MED ORDER — HYDROCODONE-ACETAMINOPHEN 5-325 MG PO TABS
1.0000 | ORAL_TABLET | Freq: Once | ORAL | Status: AC
  Administered 2014-01-09: 1 via ORAL
  Filled 2014-01-09: qty 1

## 2014-01-09 NOTE — ED Notes (Signed)
MD at bedside. 
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
## 2014-01-28 ENCOUNTER — Ambulatory Visit (HOSPITAL_COMMUNITY)
Admission: RE | Admit: 2014-01-28 | Discharge: 2014-01-28 | Disposition: A | Discharge status: Home / Self Care | Payer: No Typology Code available for payment source | Source: Ambulatory Visit | Attending: Diagnostic Radiology | Admitting: Diagnostic Radiology

## 2014-01-28 ENCOUNTER — Other Ambulatory Visit (HOSPITAL_COMMUNITY): Payer: Self-pay | Admitting: Chiropractic Medicine

## 2014-01-28 DIAGNOSIS — M25531 Pain in right wrist: Secondary | ICD-10-CM | POA: Insufficient documentation

## 2014-01-28 DIAGNOSIS — R52 Pain, unspecified: Secondary | ICD-10-CM

## 2014-01-28 DIAGNOSIS — M79631 Pain in right forearm: Secondary | ICD-10-CM | POA: Insufficient documentation

## 2014-02-08 ENCOUNTER — Encounter (HOSPITAL_COMMUNITY): Payer: Self-pay | Admitting: *Deleted

## 2014-02-08 ENCOUNTER — Inpatient Hospital Stay (HOSPITAL_COMMUNITY)
Admission: AD | Admit: 2014-02-08 | Discharge: 2014-02-08 | Disposition: A | Discharge status: Home / Self Care | Payer: No Typology Code available for payment source | Source: Ambulatory Visit | Attending: Family Medicine | Admitting: Family Medicine

## 2014-02-08 DIAGNOSIS — N946 Dysmenorrhea, unspecified: Secondary | ICD-10-CM | POA: Insufficient documentation

## 2014-02-08 DIAGNOSIS — F17210 Nicotine dependence, cigarettes, uncomplicated: Secondary | ICD-10-CM | POA: Insufficient documentation

## 2014-02-08 LAB — URINE MICROSCOPIC-ADD ON

## 2014-02-08 LAB — WET PREP, GENITAL
Trich, Wet Prep: NONE SEEN
YEAST WET PREP: NONE SEEN

## 2014-02-08 LAB — POCT PREGNANCY, URINE: Preg Test, Ur: NEGATIVE

## 2014-02-08 LAB — URINALYSIS, ROUTINE W REFLEX MICROSCOPIC
Bilirubin Urine: NEGATIVE
Glucose, UA: NEGATIVE mg/dL
KETONES UR: NEGATIVE mg/dL
LEUKOCYTES UA: NEGATIVE
NITRITE: NEGATIVE
PH: 6 (ref 5.0–8.0)
Protein, ur: NEGATIVE mg/dL
SPECIFIC GRAVITY, URINE: 1.02 (ref 1.005–1.030)
Urobilinogen, UA: 0.2 mg/dL (ref 0.0–1.0)

## 2014-02-08 LAB — HIV ANTIBODY (ROUTINE TESTING W REFLEX): HIV 1&2 Ab, 4th Generation: NONREACTIVE

## 2014-02-08 MED ORDER — KETOROLAC TROMETHAMINE 60 MG/2ML IM SOLN
60.0000 mg | Freq: Once | INTRAMUSCULAR | Status: AC
  Administered 2014-02-08: 60 mg via INTRAMUSCULAR
  Filled 2014-02-08: qty 2

## 2014-02-08 MED ORDER — ONDANSETRON 4 MG PO TBDP
4.0000 mg | ORAL_TABLET | Freq: Once | ORAL | Status: AC
  Administered 2014-02-08: 4 mg via ORAL
  Filled 2014-02-08: qty 1

## 2014-02-08 NOTE — Discharge Instructions (Signed)
Dysmenorrhea °Dysmenorrhea is pain during a menstrual period. You will have pain in the lower belly (abdomen). The pain is caused by the tightening (contracting) of the muscles of the uterus. The pain can be minor or severe. Headache, feeling sick to your stomach (nausea), throwing up (vomiting), or low back pain may occur with this condition. °HOME CARE °· Only take medicine as told by your doctor. °· Place a heating pad or hot water bottle on your lower back or belly. Do not sleep with a heating pad. °· Exercise may help lessen the pain. °· Massage the lower back or belly. °· Stop smoking. °· Avoid alcohol and caffeine. °GET HELP IF:  °· Your pain does not get better with medicine. °· You have pain during sex. °· Your pain gets worse while taking pain medicine. °· Your period bleeding is heavier than normal. °· You keep feeling sick to your stomach or keep throwing up. °GET HELP RIGHT AWAY IF: °You pass out (faint). °Document Released: 07/13/2008 Document Revised: 04/21/2013 Document Reviewed: 10/02/2012 °[employer]® Patient Information ©2015 [employer]. This information is not intended to replace advice given to you by your health care provider. Make sure you discuss any questions you have with your health care provider. ° °

## 2014-02-08 NOTE — MAU Provider Note (Signed)
Attestation of Attending Supervision of Advanced Practitioner (PA/CNM/NP): Evaluation and management procedures were performed by the Advanced Practitioner under my supervision and collaboration.  I have reviewed the Advanced Practitioner's note and chart, and I agree with the management and plan.  Donnamae Jude, MD [facility] Attending 02/08/2014 12:54 PM
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
## 2014-02-08 NOTE — MAU Note (Signed)
Pt presents to MAU with complaints of vomiting and lower abdominal cramping since this morning. PT states she started her menstrual cycle yesterday

## 2014-02-09 LAB — GC/CHLAMYDIA PROBE AMP
CT Probe RNA: NEGATIVE
GC Probe RNA: NEGATIVE

## 2014-08-26 ENCOUNTER — Encounter (HOSPITAL_COMMUNITY): Payer: Self-pay | Admitting: *Deleted

## 2014-08-26 ENCOUNTER — Inpatient Hospital Stay (HOSPITAL_COMMUNITY)
Admission: AD | Admit: 2014-08-26 | Discharge: 2014-08-26 | Disposition: A | Discharge status: Home / Self Care | Payer: Self-pay | Source: Ambulatory Visit | Attending: Obstetrics & Gynecology | Admitting: Obstetrics & Gynecology

## 2014-08-26 DIAGNOSIS — F17210 Nicotine dependence, cigarettes, uncomplicated: Secondary | ICD-10-CM | POA: Insufficient documentation

## 2014-08-26 DIAGNOSIS — A084 Viral intestinal infection, unspecified: Secondary | ICD-10-CM | POA: Insufficient documentation

## 2014-08-26 LAB — URINE MICROSCOPIC-ADD ON

## 2014-08-26 LAB — POCT PREGNANCY, URINE: PREG TEST UR: NEGATIVE

## 2014-08-26 LAB — URINALYSIS, ROUTINE W REFLEX MICROSCOPIC
Bilirubin Urine: NEGATIVE
Glucose, UA: NEGATIVE mg/dL
Ketones, ur: >80 mg/dL — AB
Leukocytes, UA: NEGATIVE
Nitrite: NEGATIVE
PROTEIN: 30 mg/dL — AB
Specific Gravity, Urine: 1.025 (ref 1.005–1.030)
UROBILINOGEN UA: 1 mg/dL (ref 0.0–1.0)
pH: 6 (ref 5.0–8.0)

## 2014-08-26 MED ORDER — ONDANSETRON 8 MG PO TBDP
8.0000 mg | ORAL_TABLET | Freq: Once | ORAL | Status: AC
  Administered 2014-08-26: 8 mg via ORAL
  Filled 2014-08-26: qty 1

## 2014-08-26 MED ORDER — ONDANSETRON 8 MG PO TBDP: 8.0000 mg | ORAL_TABLET | Freq: Three times a day (TID) | ORAL | Status: DC | PRN

## 2014-08-26 NOTE — Discharge Instructions (Signed)
[facility]iders [facility]  Phone305 [phone]     Phone: [place]                      [facility]  @[place]     Phone: [phone]  Phone: [place]     Phone: [phone]  Phone: [place] [facility] [place]                hone: [phone]  Phone: [phone]         [facility] [address] Gracy Racer      Phone: [phone]  Phone: [phone]         [place] [facility]                Phone: [phone]  [place])          Phone: [phone] [facility]   Phone: [phone]   Food Choices to Help Relieve Diarrhea When you have diarrhea, the foods you eat and your eating habits are very important. Choosing the right foods and drinks can help relieve diarrhea. Also, because diarrhea can last up to 7 days, you need to replace lost fluids and electrolytes (such as sodium, potassium, and chloride) in order to help prevent dehydration.  WHAT GENERAL GUIDELINES DO I NEED TO FOLLOW?  Slowly drink 1 cup (8 oz) of fluid for each episode of diarrhea. If you are getting enough fluid, your urine will be clear or pale yellow.  Eat starchy foods. Some good choices include white rice, white toast, pasta, low-fiber cereal, baked potatoes (without the skin), saltine crackers, and bagels.  Avoid large servings of any cooked vegetables.  Limit fruit to two servings per day. A serving is  cup or 1 small piece.  Choose foods with less than 2 g of fiber per serving.  Limit fats to less than 8 tsp (38 g) per day.  Avoid fried foods.  Eat foods that have probiotics in them. Probiotics can be found in certain dairy products.  Avoid foods and beverages that may increase  the speed at which food moves through the stomach and intestines (gastrointestinal tract). Things to avoid include:  High-fiber foods, such as dried fruit, raw fruits and vegetables, nuts, seeds, and whole grain foods.  Spicy foods and high-fat foods.  Foods and beverages sweetened with high-fructose corn syrup, honey, or sugar alcohols such as xylitol, sorbitol, and mannitol. WHAT FOODS ARE RECOMMENDED? Grains White rice. White, [place], or pita breads (fresh or toasted), including plain rolls, buns, or bagels. White pasta. Saltine, soda, or graham crackers. Pretzels. Low-fiber cereal. Cooked cereals made with water (such as cornmeal, farina, or cream cereals). Plain muffins. Matzo. Melba toast. Zwieback.  Vegetables Potatoes (without the skin). Strained tomato and vegetable juices. Most well-cooked and canned vegetables without seeds. Tender lettuce. Fruits Cooked or canned applesauce, apricots, cherries, fruit cocktail, grapefruit, peaches, pears, or plums. Fresh bananas, apples without skin, cherries, grapes, cantaloupe, grapefruit, peaches, oranges, or plums.  Meat and Other Protein Products Baked or boiled chicken. Eggs. Tofu. Fish. Seafood. Smooth peanut butter. Ground or  well-cooked tender beef, ham, veal, lamb, pork, or poultry.  Dairy Plain yogurt, kefir, and unsweetened liquid yogurt. Lactose-free milk, buttermilk, or soy milk. Plain hard cheese. Beverages Sport drinks. Clear broths. Diluted fruit juices (except prune). Regular, caffeine-free sodas such as ginger ale. Water. Decaffeinated teas. Oral rehydration solutions. Sugar-free beverages not sweetened with sugar alcohols. Other Bouillon, broth, or soups made from recommended foods.  The items listed above may not be a complete list of recommended foods or beverages. Contact your dietitian for more options. WHAT FOODS ARE NOT RECOMMENDED? Grains Whole grain, whole wheat, bran, or rye breads, rolls, pastas, crackers, and  cereals. Wild or brown rice. Cereals that contain more than 2 g of fiber per serving. Corn tortillas or taco shells. Cooked or dry oatmeal. Granola. Popcorn. Vegetables Raw vegetables. Cabbage, broccoli, Brussels sprouts, artichokes, baked beans, beet greens, corn, kale, legumes, peas, sweet potatoes, and yams. Potato skins. Cooked spinach and cabbage. Fruits Dried fruit, including raisins and dates. Raw fruits. Stewed or dried prunes. Fresh apples with skin, apricots, mangoes, pears, raspberries, and strawberries.  Meat and Other Protein Products Chunky peanut butter. Nuts and seeds. Beans and lentils. Berniece Salines.  Dairy High-fat cheeses. Milk, chocolate milk, and beverages made with milk, such as milk shakes. Cream. Ice cream. Sweets and Desserts Sweet rolls, doughnuts, and sweet breads. Pancakes and waffles. Fats and Oils Butter. Cream sauces. Margarine. Salad oils. Plain salad dressings. Olives. Avocados.  Beverages Caffeinated beverages (such as coffee, tea, soda, or energy drinks). Alcoholic beverages. Fruit juices with pulp. Prune juice. Soft drinks sweetened with high-fructose corn syrup or sugar alcohols. Other Coconut. Hot sauce. Chili powder. Mayonnaise. Gravy. Cream-based or milk-based soups.  The items listed above may not be a complete list of foods and beverages to avoid. Contact your dietitian for more information. WHAT SHOULD I DO IF I BECOME DEHYDRATED? Diarrhea can sometimes lead to dehydration. Signs of dehydration include dark urine and dry mouth and skin. If you think you are dehydrated, you should rehydrate with an oral rehydration solution. These solutions can be purchased at pharmacies, retail stores, or online.  Drink -1 cup (120-240 mL) of oral rehydration solution each time you have an episode of diarrhea. If drinking this amount makes your diarrhea worse, try drinking smaller amounts more often. For example, drink 1-3 tsp (5-15 mL) every 5-10 minutes.  A general rule for  staying hydrated is to drink 1-2 L of fluid per day. Talk to your health care provider about the specific amount you should be drinking each day. Drink enough fluids to keep your urine clear or pale yellow. Document Released: 07/07/2003 Document Revised: 04/21/2013 Document Reviewed: 03/09/2013 [facility] Patient Information 2015 [place], [place]. This information is not intended to replace advice given to you by your health care provider. Make sure you discuss any questions you have with your health care provider.
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
## 2014-08-26 NOTE — MAU Note (Signed)
PT SAYS  SHE   STARTED  VOMITING TODAY. -- CANNOT  TELL HOW  MANY  TIMES.  SAYS SHE  VOMITED 2 MTHS  AGO-  CAME HERE -.   LAST SEX- 4-20.  NO BIRTH  CONTROL    NO MEDS AT HOME.    PAIN  IN LOWER ABD-  STARTED  TODAY-

## 2015-01-11 ENCOUNTER — Encounter (HOSPITAL_COMMUNITY): Payer: Self-pay | Admitting: Emergency Medicine

## 2015-01-11 ENCOUNTER — Emergency Department (HOSPITAL_COMMUNITY)
Admission: EM | Admit: 2015-01-11 | Discharge: 2015-01-12 | Disposition: A | Discharge status: Home / Self Care | Payer: Self-pay | Attending: Emergency Medicine | Admitting: Emergency Medicine

## 2015-01-11 ENCOUNTER — Emergency Department (HOSPITAL_COMMUNITY): Payer: Self-pay

## 2015-01-11 DIAGNOSIS — R1084 Generalized abdominal pain: Secondary | ICD-10-CM

## 2015-01-11 DIAGNOSIS — Z8619 Personal history of other infectious and parasitic diseases: Secondary | ICD-10-CM | POA: Insufficient documentation

## 2015-01-11 DIAGNOSIS — Z3202 Encounter for pregnancy test, result negative: Secondary | ICD-10-CM | POA: Insufficient documentation

## 2015-01-11 DIAGNOSIS — K529 Noninfective gastroenteritis and colitis, unspecified: Secondary | ICD-10-CM | POA: Insufficient documentation

## 2015-01-11 DIAGNOSIS — Z872 Personal history of diseases of the skin and subcutaneous tissue: Secondary | ICD-10-CM | POA: Insufficient documentation

## 2015-01-11 DIAGNOSIS — N83209 Unspecified ovarian cyst, unspecified side: Secondary | ICD-10-CM

## 2015-01-11 DIAGNOSIS — Z8742 Personal history of other diseases of the female genital tract: Secondary | ICD-10-CM | POA: Insufficient documentation

## 2015-01-11 DIAGNOSIS — Z79899 Other long term (current) drug therapy: Secondary | ICD-10-CM | POA: Insufficient documentation

## 2015-01-11 DIAGNOSIS — Z720 Tobacco use: Secondary | ICD-10-CM | POA: Insufficient documentation

## 2015-01-11 DIAGNOSIS — Z87440 Personal history of urinary (tract) infections: Secondary | ICD-10-CM | POA: Insufficient documentation

## 2015-01-11 LAB — CBC
HCT: 38.2 % (ref 36.0–46.0)
HEMOGLOBIN: 12.7 g/dL (ref 12.0–15.0)
MCH: 27.6 pg (ref 26.0–34.0)
MCHC: 33.2 g/dL (ref 30.0–36.0)
MCV: 83 fL (ref 78.0–100.0)
Platelets: 222 10*3/uL (ref 150–400)
RBC: 4.6 MIL/uL (ref 3.87–5.11)
RDW: 13.4 % (ref 11.5–15.5)
WBC: 6.5 10*3/uL (ref 4.0–10.5)

## 2015-01-11 LAB — URINALYSIS, ROUTINE W REFLEX MICROSCOPIC
Bilirubin Urine: NEGATIVE
Glucose, UA: NEGATIVE mg/dL
Ketones, ur: 40 mg/dL — AB
Leukocytes, UA: NEGATIVE
Nitrite: NEGATIVE
Protein, ur: NEGATIVE mg/dL
Urobilinogen, UA: 1 mg/dL (ref 0.0–1.0)
pH: 5 (ref 5.0–8.0)

## 2015-01-11 LAB — COMPREHENSIVE METABOLIC PANEL
ALBUMIN: 4.5 g/dL (ref 3.5–5.0)
ALT: 15 U/L (ref 14–54)
ANION GAP: 7 (ref 5–15)
AST: 27 U/L (ref 15–41)
Alkaline Phosphatase: 81 U/L (ref 38–126)
BUN: 7 mg/dL (ref 6–20)
CO2: 24 mmol/L (ref 22–32)
Calcium: 9.4 mg/dL (ref 8.9–10.3)
Chloride: 108 mmol/L (ref 101–111)
Creatinine, Ser: 0.56 mg/dL (ref 0.44–1.00)
GFR calc Af Amer: >60 mL/min (ref >60)
GFR calc non Af Amer: >60 mL/min (ref >60)
GLUCOSE: 110 mg/dL — AB (ref 65–99)
Potassium: 4.1 mmol/L (ref 3.5–5.1)
SODIUM: 139 mmol/L (ref 135–145)
TOTAL PROTEIN: 8.5 g/dL — AB (ref 6.5–8.1)
Total Bilirubin: 0.7 mg/dL (ref 0.3–1.2)

## 2015-01-11 LAB — URINE MICROSCOPIC-ADD ON

## 2015-01-11 LAB — I-STAT BETA HCG BLOOD, ED (MC, WL, AP ONLY)

## 2015-01-11 LAB — LIPASE, BLOOD: Lipase: 18 U/L — ABNORMAL LOW (ref 22–51)

## 2015-01-11 MED ORDER — SODIUM CHLORIDE 0.9 % IV BOLUS (SEPSIS)
1000.0000 mL | Freq: Once | INTRAVENOUS | Status: AC
  Administered 2015-01-11: 1000 mL via INTRAVENOUS

## 2015-01-11 MED ORDER — ONDANSETRON HCL 4 MG/2ML IJ SOLN
4.0000 mg | Freq: Once | INTRAMUSCULAR | Status: AC
  Administered 2015-01-11: 4 mg via INTRAVENOUS
  Filled 2015-01-11: qty 2

## 2015-01-11 MED ORDER — IOHEXOL 300 MG/ML  SOLN
25.0000 mL | Freq: Once | INTRAMUSCULAR | Status: AC | PRN
  Administered 2015-01-11: 25 mL via ORAL

## 2015-01-11 MED ORDER — HYDROMORPHONE HCL 1 MG/ML IJ SOLN
1.0000 mg | Freq: Once | INTRAMUSCULAR | Status: AC
  Administered 2015-01-11: 1 mg via INTRAVENOUS
  Filled 2015-01-11: qty 1

## 2015-01-11 MED ORDER — IOHEXOL 300 MG/ML  SOLN
100.0000 mL | Freq: Once | INTRAMUSCULAR | Status: AC | PRN
  Administered 2015-01-11: 100 mL via INTRAVENOUS

## 2015-01-11 NOTE — Progress Notes (Signed)
EDCM spoke to patient at bedside. Patient confirms she does not have a pcp or insurance living in [place].  [facility] provide patient with contact information to [facility], informed patient of services there.  EDCM also provided patient with list of pcps who accept self pay patients, list of discount pharmacies and websites needymeds.org and GoodRX.com for medication assistance, phone number to inquire about the orange card, phone number to inquire about Medic[employer], phone number to inquire about the [place], financial resources in the community such as local churches, [employer], urban ministries, and dental assistance for uninsured patients.  Patient thankfulf or resources.  No further EDCM needs at this time.  Patient reports she will be getting insurance through her job in November.

## 2015-01-11 NOTE — ED Notes (Signed)
Pt c/o abd pain with vomiting that started this morning. Pt denies diarrhea.
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
## 2015-01-12 MED ORDER — ALBUTEROL SULFATE HFA 108 (90 BASE) MCG/ACT IN AERS
2.0000 | INHALATION_SPRAY | Freq: Four times a day (QID) | RESPIRATORY_TRACT | Status: DC | PRN
Start: 2015-01-12 — End: 2015-11-30

## 2015-01-12 MED ORDER — PROMETHAZINE HCL 25 MG PO TABS: 25.0000 mg | ORAL_TABLET | Freq: Three times a day (TID) | ORAL | Status: DC | PRN

## 2015-01-12 MED ORDER — HYDROCODONE-ACETAMINOPHEN 5-325 MG PO TABS: 1.0000 | ORAL_TABLET | Freq: Four times a day (QID) | ORAL | Status: DC | PRN

## 2015-01-12 NOTE — ED Notes (Signed)
Patient given note for work, patient initially given note excusing for 01/12/15, patient requested note be changed to allow attendance for work on 01/12/2015.  Patient requested albuterol rx, PA notified, rx given.

## 2015-01-12 NOTE — Discharge Instructions (Signed)
Return here as needed.  Follow-up with a primary care doctor.  Slowly increase your fluid intake

## 2015-06-01 ENCOUNTER — Encounter: Payer: Self-pay | Admitting: Obstetrics

## 2015-06-01 ENCOUNTER — Ambulatory Visit (INDEPENDENT_AMBULATORY_CARE_PROVIDER_SITE_OTHER): Payer: Managed Care, Other (non HMO) | Admitting: Obstetrics

## 2015-06-01 VITALS — BP 103/70 | HR 76 | Ht 65.0 in | Wt 124.0 lb

## 2015-06-01 DIAGNOSIS — N946 Dysmenorrhea, unspecified: Secondary | ICD-10-CM

## 2015-06-01 DIAGNOSIS — Z01419 Encounter for gynecological examination (general) (routine) without abnormal findings: Secondary | ICD-10-CM | POA: Diagnosis not present

## 2015-06-01 DIAGNOSIS — Z3169 Encounter for other general counseling and advice on procreation: Secondary | ICD-10-CM

## 2015-06-01 MED ORDER — PNV PRENATAL PLUS MULTIVITAMIN 27-1 MG PO TABS
1.0000 | ORAL_TABLET | Freq: Every day | ORAL | Status: DC
Start: 2015-06-01 — End: 2015-11-30

## 2015-06-01 MED ORDER — HYDROCODONE-ACETAMINOPHEN 5-325 MG PO TABS: 1.0000 | ORAL_TABLET | Freq: Four times a day (QID) | ORAL | Status: DC | PRN

## 2015-06-01 MED ORDER — NAPROXEN SODIUM 550 MG PO TABS: 550.0000 mg | ORAL_TABLET | Freq: Two times a day (BID) | ORAL | Status: DC
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
## 2015-06-04 LAB — SURESWAB, VAGINOSIS/VAGINITIS PLUS
Atopobium vaginae: 6.6 Log (cells/mL)
C. GLABRATA, DNA: NOT DETECTED
C. TROPICALIS, DNA: NOT DETECTED
C. albicans, DNA: NOT DETECTED
C. parapsilosis, DNA: NOT DETECTED
C. trachomatis RNA, TMA: NOT DETECTED
LACTOBACILLUS SPECIES: NOT DETECTED Log (cells/mL)
MEGASPHAERA SPECIES: >8 Log (cells/mL)
N. gonorrhoeae RNA, TMA: NOT DETECTED
T. VAGINALIS RNA, QL TMA: NOT DETECTED

## 2015-06-05 ENCOUNTER — Other Ambulatory Visit: Payer: Self-pay | Admitting: Obstetrics

## 2015-06-05 DIAGNOSIS — B9689 Other specified bacterial agents as the cause of diseases classified elsewhere: Secondary | ICD-10-CM

## 2015-06-05 DIAGNOSIS — N760 Acute vaginitis: Principal | ICD-10-CM

## 2015-06-05 MED ORDER — METRONIDAZOLE 500 MG PO TABS: 500.0000 mg | ORAL_TABLET | Freq: Two times a day (BID) | ORAL | Status: DC

## 2015-06-06 LAB — PAP, TP IMAGING W/ HPV RNA, RFLX HPV TYPE 16,18/45: HPV mRNA, High Risk: NOT DETECTED

## 2015-06-10 ENCOUNTER — Encounter: Payer: Self-pay | Admitting: *Deleted

## 2015-08-09 ENCOUNTER — Telehealth: Payer: Self-pay | Admitting: *Deleted

## 2015-08-09 DIAGNOSIS — B3731 Acute candidiasis of vulva and vagina: Secondary | ICD-10-CM

## 2015-08-09 DIAGNOSIS — N760 Acute vaginitis: Principal | ICD-10-CM

## 2015-08-09 DIAGNOSIS — B9689 Other specified bacterial agents as the cause of diseases classified elsewhere: Secondary | ICD-10-CM

## 2015-08-09 DIAGNOSIS — B373 Candidiasis of vulva and vagina: Secondary | ICD-10-CM

## 2015-08-09 MED ORDER — FLUCONAZOLE 150 MG PO TABS: 150.0000 mg | ORAL_TABLET | Freq: Once | ORAL | Status: DC

## 2015-08-09 MED ORDER — METRONIDAZOLE 500 MG PO TABS: 500.0000 mg | ORAL_TABLET | Freq: Two times a day (BID) | ORAL | Status: DC

## 2015-08-09 NOTE — Telephone Encounter (Signed)
Patient is requesting treatment for yeast and BV sent to her pharmacy. She used the 1 day Monistat- but her symptoms are not better. Rx for Diflucan and Flagyl sent to pharmacy per provider permission.

## 2015-11-28 ENCOUNTER — Telehealth: Payer: Self-pay

## 2015-11-30 ENCOUNTER — Ambulatory Visit (INDEPENDENT_AMBULATORY_CARE_PROVIDER_SITE_OTHER): Payer: Managed Care, Other (non HMO) | Admitting: Obstetrics

## 2015-11-30 ENCOUNTER — Encounter: Payer: Self-pay | Admitting: Obstetrics

## 2015-11-30 VITALS — BP 103/64 | HR 67 | Wt 118.8 lb

## 2015-11-30 DIAGNOSIS — Z3169 Encounter for other general counseling and advice on procreation: Secondary | ICD-10-CM | POA: Diagnosis not present

## 2015-11-30 DIAGNOSIS — N946 Dysmenorrhea, unspecified: Secondary | ICD-10-CM | POA: Diagnosis not present

## 2015-11-30 DIAGNOSIS — J4520 Mild intermittent asthma, uncomplicated: Secondary | ICD-10-CM

## 2015-11-30 MED ORDER — ALBUTEROL SULFATE HFA 108 (90 BASE) MCG/ACT IN AERS: 2.0000 | INHALATION_SPRAY | Freq: Four times a day (QID) | RESPIRATORY_TRACT | 99 refills | Status: DC | PRN

## 2015-11-30 MED ORDER — NAPROXEN SODIUM 550 MG PO TABS: 550.0000 mg | ORAL_TABLET | Freq: Two times a day (BID) | ORAL | 5 refills | Status: DC

## 2015-11-30 MED ORDER — PRENATE MINI 29-0.6-0.4-350 MG PO CAPS: 1.0000 | ORAL_CAPSULE | Freq: Every day | ORAL | 3 refills | Status: DC
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
## 2015-12-05 NOTE — Telephone Encounter (Signed)
Left mess for pt. To call

## 2015-12-07 ENCOUNTER — Telehealth: Payer: Self-pay | Admitting: *Deleted

## 2015-12-07 NOTE — Telephone Encounter (Signed)
Talked with patient states she has bad nausea and vomiting and pain with her cycles.Patient is currently on Naprosyn for pain.Phenergan 25 mg po 1 every 6 hours as needed for vomiting called to [facility] [phone].

## 2015-12-09 ENCOUNTER — Telehealth: Payer: Self-pay | Admitting: Obstetrics

## 2015-12-09 NOTE — Telephone Encounter (Signed)
Pt missed work on this Wed and Thursday d/t issues and pain from her cycle. Pt was wondering if Dr. Jodi Mourning would write her a note excusing her from work on those two days. Please advise. Pt would like to be notified so she can come pick up the work note.

## 2015-12-28 NOTE — Telephone Encounter (Signed)
Please advise on work note.

## 2016-01-05 ENCOUNTER — Inpatient Hospital Stay (HOSPITAL_COMMUNITY)
Admission: AD | Admit: 2016-01-05 | Discharge: 2016-01-05 | Disposition: A | Discharge status: Home / Self Care | Payer: Managed Care, Other (non HMO) | Source: Ambulatory Visit | Attending: Obstetrics and Gynecology | Admitting: Obstetrics and Gynecology

## 2016-01-05 ENCOUNTER — Encounter (HOSPITAL_COMMUNITY): Payer: Self-pay | Admitting: *Deleted

## 2016-01-05 DIAGNOSIS — N946 Dysmenorrhea, unspecified: Secondary | ICD-10-CM | POA: Diagnosis not present

## 2016-01-05 DIAGNOSIS — R112 Nausea with vomiting, unspecified: Secondary | ICD-10-CM | POA: Insufficient documentation

## 2016-01-05 DIAGNOSIS — L409 Psoriasis, unspecified: Secondary | ICD-10-CM | POA: Diagnosis not present

## 2016-01-05 DIAGNOSIS — F17210 Nicotine dependence, cigarettes, uncomplicated: Secondary | ICD-10-CM | POA: Insufficient documentation

## 2016-01-05 DIAGNOSIS — N83209 Unspecified ovarian cyst, unspecified side: Secondary | ICD-10-CM | POA: Insufficient documentation

## 2016-01-05 DIAGNOSIS — R109 Unspecified abdominal pain: Secondary | ICD-10-CM | POA: Diagnosis present

## 2016-01-05 LAB — URINE MICROSCOPIC-ADD ON
BACTERIA UA: NONE SEEN
Squamous Epithelial / LPF: NONE SEEN
WBC UA: NONE SEEN WBC/hpf (ref 0–5)

## 2016-01-05 LAB — URINALYSIS, ROUTINE W REFLEX MICROSCOPIC
BILIRUBIN URINE: NEGATIVE
GLUCOSE, UA: NEGATIVE mg/dL
Nitrite: POSITIVE — AB
PH: 7 (ref 5.0–8.0)
Protein, ur: >300 mg/dL — AB
SPECIFIC GRAVITY, URINE: 1.02 (ref 1.005–1.030)

## 2016-01-05 LAB — POCT PREGNANCY, URINE: Preg Test, Ur: NEGATIVE

## 2016-01-05 MED ORDER — PROMETHAZINE HCL 25 MG PO TABS
25.0000 mg | ORAL_TABLET | Freq: Four times a day (QID) | ORAL | 2 refills | Status: DC | PRN
Start: 2016-01-05 — End: 2017-02-22

## 2016-01-05 MED ORDER — PROMETHAZINE HCL 25 MG/ML IJ SOLN
25.0000 mg | Freq: Once | INTRAMUSCULAR | Status: AC
  Administered 2016-01-05: 25 mg via INTRAVENOUS
  Filled 2016-01-05: qty 1

## 2016-01-05 MED ORDER — NALBUPHINE HCL 10 MG/ML IJ SOLN
10.0000 mg | Freq: Once | INTRAMUSCULAR | Status: AC
  Administered 2016-01-05: 10 mg via INTRAVENOUS
  Filled 2016-01-05: qty 1

## 2016-01-05 MED ORDER — SODIUM CHLORIDE 0.9 % IV BOLUS (SEPSIS): 1000.0000 mL | Freq: Once | INTRAVENOUS | Status: DC
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
## 2016-01-05 NOTE — MAU Note (Signed)
Pt stated her period started today and she started having abd pain and vomiting. Cant keep anything down. Took a zofran without relief.

## 2016-01-05 NOTE — Progress Notes (Signed)
Pt feels better. Pain has resolved. Sleepy. No more N/V. Will give some clear liquids and plan to d/c home. Work note for today. Rx for Phenergan and to continue with Naprosyn. F/U with Dr. Jodi Mourning.

## 2016-06-05 ENCOUNTER — Ambulatory Visit: Payer: Managed Care, Other (non HMO) | Admitting: Obstetrics

## 2016-06-05 ENCOUNTER — Ambulatory Visit: Payer: Self-pay | Admitting: Obstetrics

## 2016-12-10 ENCOUNTER — Other Ambulatory Visit: Payer: Self-pay | Admitting: Obstetrics

## 2016-12-10 DIAGNOSIS — J4520 Mild intermittent asthma, uncomplicated: Secondary | ICD-10-CM

## 2016-12-10 MED ORDER — ALBUTEROL SULFATE HFA 108 (90 BASE) MCG/ACT IN AERS
2.0000 | INHALATION_SPRAY | Freq: Four times a day (QID) | RESPIRATORY_TRACT | 99 refills | Status: DC | PRN
Start: 2016-12-10 — End: 2019-11-12

## 2016-12-24 ENCOUNTER — Encounter (HOSPITAL_COMMUNITY): Payer: Self-pay | Admitting: Emergency Medicine

## 2016-12-24 ENCOUNTER — Emergency Department (HOSPITAL_COMMUNITY)
Admission: EM | Admit: 2016-12-24 | Discharge: 2016-12-24 | Disposition: A | Discharge status: Home / Self Care | Payer: Managed Care, Other (non HMO) | Attending: Emergency Medicine | Admitting: Emergency Medicine

## 2016-12-24 DIAGNOSIS — F17210 Nicotine dependence, cigarettes, uncomplicated: Secondary | ICD-10-CM | POA: Insufficient documentation

## 2016-12-24 DIAGNOSIS — R1110 Vomiting, unspecified: Secondary | ICD-10-CM | POA: Insufficient documentation

## 2016-12-24 DIAGNOSIS — Z79899 Other long term (current) drug therapy: Secondary | ICD-10-CM | POA: Insufficient documentation

## 2016-12-24 DIAGNOSIS — N946 Dysmenorrhea, unspecified: Secondary | ICD-10-CM | POA: Insufficient documentation

## 2016-12-24 MED ORDER — SODIUM CHLORIDE 0.9 % IV BOLUS (SEPSIS)
1000.0000 mL | Freq: Once | INTRAVENOUS | Status: AC
  Administered 2016-12-24: 1000 mL via INTRAVENOUS

## 2016-12-24 MED ORDER — ONDANSETRON 4 MG PO TBDP: 4.0000 mg | ORAL_TABLET | Freq: Three times a day (TID) | ORAL | 0 refills | Status: DC | PRN

## 2016-12-24 MED ORDER — HYDROMORPHONE HCL 1 MG/ML IJ SOLN
0.5000 mg | Freq: Once | INTRAMUSCULAR | Status: AC
  Administered 2016-12-24: 0.5 mg via INTRAVENOUS
  Filled 2016-12-24: qty 1

## 2016-12-24 MED ORDER — ONDANSETRON HCL 4 MG/2ML IJ SOLN
4.0000 mg | Freq: Once | INTRAMUSCULAR | Status: AC
  Administered 2016-12-24: 4 mg via INTRAVENOUS
  Filled 2016-12-24: qty 2

## 2016-12-24 MED ORDER — HYDROMORPHONE HCL 1 MG/ML IJ SOLN: 1.0000 mg | Freq: Once | INTRAMUSCULAR | Status: DC

## 2016-12-24 MED ORDER — DICLOFENAC SODIUM 75 MG PO TBEC: 75.0000 mg | DELAYED_RELEASE_TABLET | Freq: Two times a day (BID) | ORAL | 0 refills | Status: DC

## 2016-12-24 NOTE — Discharge Instructions (Signed)
Schedule appointment at [facility] clinic for evaluation
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
## 2016-12-24 NOTE — ED Triage Notes (Addendum)
Pt in from home via [facility] with n/v, abd pain and weakness since 0500. Per EMS, pt was hypotensive with orthos. BP 106/60 lying, 80 palpated standing. Given 500 ml's NS en route. CBG 109, a&ox4, denies pain or sob.

## 2017-02-21 ENCOUNTER — Ambulatory Visit: Payer: Managed Care, Other (non HMO) | Admitting: Obstetrics and Gynecology

## 2017-02-22 ENCOUNTER — Encounter: Payer: Self-pay | Admitting: Family Medicine

## 2017-02-22 ENCOUNTER — Ambulatory Visit (INDEPENDENT_AMBULATORY_CARE_PROVIDER_SITE_OTHER): Payer: Self-pay | Admitting: Family Medicine

## 2017-02-22 VITALS — BP 115/80 | HR 67 | Ht 65.0 in | Wt 115.0 lb

## 2017-02-22 DIAGNOSIS — N946 Dysmenorrhea, unspecified: Secondary | ICD-10-CM

## 2017-02-22 DIAGNOSIS — N3001 Acute cystitis with hematuria: Secondary | ICD-10-CM

## 2017-02-22 LAB — POCT URINALYSIS DIP (DEVICE)
Bilirubin Urine: NEGATIVE
Glucose, UA: NEGATIVE mg/dL
KETONES UR: NEGATIVE mg/dL
Nitrite: POSITIVE — AB
PH: 6 (ref 5.0–8.0)
PROTEIN: NEGATIVE mg/dL
Specific Gravity, Urine: 1.01 (ref 1.005–1.030)
UROBILINOGEN UA: 0.2 mg/dL (ref 0.0–1.0)

## 2017-02-22 MED ORDER — NORGESTIMATE-ETH ESTRADIOL 0.25-35 MG-MCG PO TABS: 1.0000 | ORAL_TABLET | Freq: Every day | ORAL | 11 refills | Status: DC

## 2017-02-22 MED ORDER — CIPROFLOXACIN HCL 500 MG PO TABS: 500.0000 mg | ORAL_TABLET | Freq: Two times a day (BID) | ORAL | 0 refills | Status: DC

## 2017-02-22 NOTE — Progress Notes (Signed)
   Subjective:    Patient ID: Christina Cunningham, female    DOB: 08-14-1980, 36 y.o.   MRN: [id number]  HPI Patient seen for dysmenorrhea and pelvic pain.  She has a long-standing history of this has been seen by Dr. Jodi Mourning in the past.  She is currently without insurance, although should be getting insurance in the next month.  She has severe pain during her menses, but also has intermittent sharp, cramping pelvic pains throughout the month.  No palliating or provoking factors other than NSAIDs and pain medicines.  She does consider getting pregnant at some point in her life, although has tried in the past to get pregnant and has not yet been pregnant.   Review of Systems     Objective:   Physical Exam  Constitutional: She is oriented to person, place, and time. She appears well-developed and well-nourished.  Cardiovascular: Normal rate, regular rhythm and normal heart sounds.   Pulmonary/Chest: Effort normal and breath sounds normal.  Abdominal: Soft. She exhibits no mass. There is tenderness. There is no rebound and no guarding.  Neurological: She is alert and oriented to person, place, and time.  Skin: Skin is warm and dry.  Psychiatric: She has a normal mood and affect. Her behavior is normal. Judgment and thought content normal.       Assessment & Plan:  1. Dysmenorrhea ? Endometriosis. Discussed treatment options for dysmenorrhea vs endometriosis. Will start with OCPs, as this is often very helpful. Will do trial for 3 months.  2. Acute cystitis with hematuria Ciprofloxacin. Urine culture

## 2017-02-22 NOTE — Progress Notes (Signed)
Pt stated lower abdominal have sharp/cramping pain with/without menstrual for about for more than 5 years. Pt seen to ED 12/24/2016 for the menstrual pain.

## 2017-06-25 ENCOUNTER — Emergency Department (HOSPITAL_BASED_OUTPATIENT_CLINIC_OR_DEPARTMENT_OTHER): Payer: Self-pay

## 2017-06-25 ENCOUNTER — Other Ambulatory Visit: Payer: Self-pay

## 2017-06-25 ENCOUNTER — Encounter (HOSPITAL_BASED_OUTPATIENT_CLINIC_OR_DEPARTMENT_OTHER): Payer: Self-pay | Admitting: Emergency Medicine

## 2017-06-25 ENCOUNTER — Emergency Department (HOSPITAL_BASED_OUTPATIENT_CLINIC_OR_DEPARTMENT_OTHER)
Admission: EM | Admit: 2017-06-25 | Discharge: 2017-06-25 | Disposition: A | Discharge status: Home / Self Care | Payer: Self-pay | Attending: Emergency Medicine | Admitting: Emergency Medicine

## 2017-06-25 DIAGNOSIS — L03115 Cellulitis of right lower limb: Secondary | ICD-10-CM | POA: Insufficient documentation

## 2017-06-25 DIAGNOSIS — D219 Benign neoplasm of connective and other soft tissue, unspecified: Secondary | ICD-10-CM

## 2017-06-25 DIAGNOSIS — Z79899 Other long term (current) drug therapy: Secondary | ICD-10-CM | POA: Insufficient documentation

## 2017-06-25 DIAGNOSIS — L03119 Cellulitis of unspecified part of limb: Secondary | ICD-10-CM

## 2017-06-25 DIAGNOSIS — F17210 Nicotine dependence, cigarettes, uncomplicated: Secondary | ICD-10-CM | POA: Insufficient documentation

## 2017-06-25 DIAGNOSIS — D259 Leiomyoma of uterus, unspecified: Secondary | ICD-10-CM | POA: Insufficient documentation

## 2017-06-25 LAB — CBC WITH DIFFERENTIAL/PLATELET
BASOS PCT: 0 %
Basophils Absolute: 0 10*3/uL (ref 0.0–0.1)
EOS ABS: 0.1 10*3/uL (ref 0.0–0.7)
Eosinophils Relative: 2 %
HCT: 33.9 % — ABNORMAL LOW (ref 36.0–46.0)
Hemoglobin: 11.4 g/dL — ABNORMAL LOW (ref 12.0–15.0)
Lymphocytes Relative: 29 %
Lymphs Abs: 1.2 10*3/uL (ref 0.7–4.0)
MCH: 27.6 pg (ref 26.0–34.0)
MCHC: 33.6 g/dL (ref 30.0–36.0)
MCV: 82.1 fL (ref 78.0–100.0)
MONOS PCT: 13 %
Monocytes Absolute: 0.6 10*3/uL (ref 0.1–1.0)
NEUTROS PCT: 56 %
Neutro Abs: 2.3 10*3/uL (ref 1.7–7.7)
PLATELETS: 186 10*3/uL (ref 150–400)
RBC: 4.13 MIL/uL (ref 3.87–5.11)
RDW: 13.8 % (ref 11.5–15.5)
WBC: 4.1 10*3/uL (ref 4.0–10.5)

## 2017-06-25 LAB — COMPREHENSIVE METABOLIC PANEL
ALBUMIN: 3.5 g/dL (ref 3.5–5.0)
ALT: 13 U/L — ABNORMAL LOW (ref 14–54)
ANION GAP: 3 — AB (ref 5–15)
AST: 23 U/L (ref 15–41)
Alkaline Phosphatase: 53 U/L (ref 38–126)
BUN: 6 mg/dL (ref 6–20)
CO2: 23 mmol/L (ref 22–32)
Calcium: 8.2 mg/dL — ABNORMAL LOW (ref 8.9–10.3)
Chloride: 112 mmol/L — ABNORMAL HIGH (ref 101–111)
Creatinine, Ser: 0.5 mg/dL (ref 0.44–1.00)
GFR calc Af Amer: >60 mL/min (ref >60)
GFR calc non Af Amer: >60 mL/min (ref >60)
GLUCOSE: 102 mg/dL — AB (ref 65–99)
POTASSIUM: 4.1 mmol/L (ref 3.5–5.1)
Sodium: 138 mmol/L (ref 135–145)
Total Bilirubin: 0.5 mg/dL (ref 0.3–1.2)
Total Protein: 6.8 g/dL (ref 6.5–8.1)

## 2017-06-25 LAB — URINALYSIS, ROUTINE W REFLEX MICROSCOPIC
Bilirubin Urine: NEGATIVE
Glucose, UA: NEGATIVE mg/dL
Hgb urine dipstick: NEGATIVE
Ketones, ur: NEGATIVE mg/dL
LEUKOCYTES UA: NEGATIVE
Nitrite: NEGATIVE
PROTEIN: NEGATIVE mg/dL
Specific Gravity, Urine: <1.005 — ABNORMAL LOW (ref 1.005–1.030)
pH: 6.5 (ref 5.0–8.0)

## 2017-06-25 LAB — PREGNANCY, URINE: Preg Test, Ur: NEGATIVE

## 2017-06-25 MED ORDER — MORPHINE SULFATE (PF) 4 MG/ML IV SOLN
4.0000 mg | Freq: Once | INTRAVENOUS | Status: AC
Start: 2017-06-25 — End: 2017-06-25
  Administered 2017-06-25: 4 mg via INTRAVENOUS
  Filled 2017-06-25: qty 1

## 2017-06-25 MED ORDER — HYDROCODONE-ACETAMINOPHEN 5-325 MG PO TABS: 1.0000 | ORAL_TABLET | Freq: Four times a day (QID) | ORAL | 0 refills | Status: DC | PRN

## 2017-06-25 MED ORDER — DOXYCYCLINE HYCLATE 100 MG IV SOLR
INTRAVENOUS | Status: AC
  Filled 2017-06-25: qty 100

## 2017-06-25 MED ORDER — DOXYCYCLINE HYCLATE 100 MG IV SOLR
100.0000 mg | Freq: Once | INTRAVENOUS | Status: AC
  Administered 2017-06-25: 100 mg via INTRAVENOUS
  Filled 2017-06-25: qty 100

## 2017-06-25 MED ORDER — DOXYCYCLINE HYCLATE 100 MG PO CAPS: 100.0000 mg | ORAL_CAPSULE | Freq: Two times a day (BID) | ORAL | 0 refills | Status: DC

## 2017-06-25 MED ORDER — SODIUM CHLORIDE 0.9 % IV BOLUS (SEPSIS)
1000.0000 mL | Freq: Once | INTRAVENOUS | Status: AC
  Administered 2017-06-25: 1000 mL via INTRAVENOUS

## 2017-06-25 MED FILL — HYDROCODON-APAP 5-325: 5-325 | 2 days supply | Qty: 6 | Fill #0

## 2017-06-25 MED FILL — DOXYCYCLINE HYCLATE 100 MG: 100 | 7 days supply | Qty: 14 | Fill #0

## 2017-06-25 NOTE — ED Triage Notes (Signed)
Patient reports lower abdominal cramping which began last night and woke her up out of her sleep.  States she went to the bathroom and urinated.  States that this was burning.    Additionally has a wound to RLE.  States that she was told this was psoriasis and was trying to manage this at home but it has not gotten any better.

## 2017-06-25 NOTE — ED Notes (Signed)
Patient remains in ultrasound.

## 2017-06-25 NOTE — ED Notes (Signed)
Patient transported to x-ray. ?
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
## 2017-06-25 NOTE — Discharge Instructions (Signed)
Take doxycycline as prescribed.   Take motrin for pain.   Take vicodin for severe pain.   See your [facility] doctor regarding your fibroids   Return to ER if you have worse rash, fever, severe pain, abnormal vaginal bleeding or discharge

## 2017-06-27 LAB — URINE CULTURE

## 2017-06-28 ENCOUNTER — Telehealth: Payer: Self-pay

## 2017-06-28 NOTE — Progress Notes (Signed)
ED Antimicrobial Stewardship Positive Culture Follow Up   Christina Cunningham is an 37 y.o. female who presented to [facility] on 06/25/2017 with a chief complaint of  Chief Complaint  Patient presents with  . Dysuria  . Leg Problem    Recent Results (from the past 720 hour(s))  Urine culture     Status: Abnormal   Collection Time: 06/25/17  8:38 AM  Result Value Ref Range Status   Specimen Description   Final    URINE, CLEAN CATCH Performed at [facility], [address] [place], [place] [address]    Special Requests   Final    NONE Performed at [facility], [address] [place], [place] [address]    Culture >=100,000 COLONIES/mL KLEBSIELLA PNEUMONIAE (A)  Final   Report Status 06/27/2017 FINAL  Final   Organism ID, Bacteria KLEBSIELLA PNEUMONIAE (A)  Final      Susceptibility   Klebsiella pneumoniae - MIC*    AMPICILLIN RESISTANT Resistant     CEFAZOLIN <=4 SENSITIVE Sensitive     CEFTRIAXONE <=1 SENSITIVE Sensitive     CIPROFLOXACIN <=0.25 SENSITIVE Sensitive     GENTAMICIN <=1 SENSITIVE Sensitive     IMIPENEM <=0.25 SENSITIVE Sensitive     NITROFURANTOIN <=16 SENSITIVE Sensitive     TRIMETH/SULFA <=20 SENSITIVE Sensitive     AMPICILLIN/SULBACTAM <=2 SENSITIVE Sensitive     PIP/TAZO <=4 SENSITIVE Sensitive     Extended ESBL NEGATIVE Sensitive     * >=100,000 COLONIES/mL KLEBSIELLA PNEUMONIAE    [x]  Patient discharged originally without antimicrobial agent and treatment is now indicated  Call patient. If no urinary symptoms: finish doxycycline. If urinary symptoms: finish doxycycline AND start Keflex 500mg  PO TID  ED Provider: Delia Heady, PA-C   Norva Riffle 06/28/2017, 8:04 AM Infectious Diseases Pharmacist Phone# [phone]

## 2017-06-28 NOTE — Telephone Encounter (Signed)
Post ED Visit - Positive Culture Follow-up: Unsuccessful Patient Follow-up  Culture assessed and recommendations reviewed by:  []  Elenor Quinones, Pharm.D. []  Heide Guile, Pharm.D., BCPS AQ-ID []  Parks Neptune, Pharm.D., BCPS []  Alycia Rossetti, Pharm.D., BCPS []  [place], Pharm.D., BCPS, AAHIVP [x]  Legrand Como, Pharm.D., BCPS, AAHIVP []  Salome Arnt, PharmD, BCPS []  Dimitri Ped, PharmD, BCPS []  Vincenza Hews, PharmD, BCPS  Positive urine culture  Symptom check may need to  add Keflex []  Patient discharged without antimicrobial prescription and treatment is now indicated []  Organism is resistant to prescribed ED discharge antimicrobial []  Patient with positive blood cultures   Unable to contact patient after 3 attempts, letter will be sent to address on file  Genia Del 06/28/2017, 1:06 PM

## 2017-07-06 ENCOUNTER — Telehealth: Payer: Self-pay | Admitting: *Deleted

## 2017-07-06 NOTE — Telephone Encounter (Signed)
Contacted by patient in response to letter sent to address on file.  UTI symptoms resolved and no further abx treatment needed at this time.

## 2017-09-05 IMAGING — CT CT ABD-PELV W/ CM
2 of 4 series · 16 of 46 positions shown, 18 images · IV contrast (OMNIPAQUE 300)
Comparison: 01/06/2010

CLINICAL DATA: Abdominal pain with vomiting that began this morning

EXAM:
CT ABDOMEN AND PELVIS WITH CONTRAST
TECHNIQUE: Multidetector CT imaging of the abdomen and pelvis was performed
using the standard protocol following bolus administration of
intravenous contrast.
CONTRAST:  25mL OMNIPAQUE IOHEXOL 300 MG/ML SOLN, 100mL OMNIPAQUE
IOHEXOL 300 MG/ML SOLN

[Series 2: abd/pel with · axial · 0.61mm/px · z∈[-254,+132]mm · 13 of 85 slices shown, 15 images]
[im 4/85  soft-tissue]
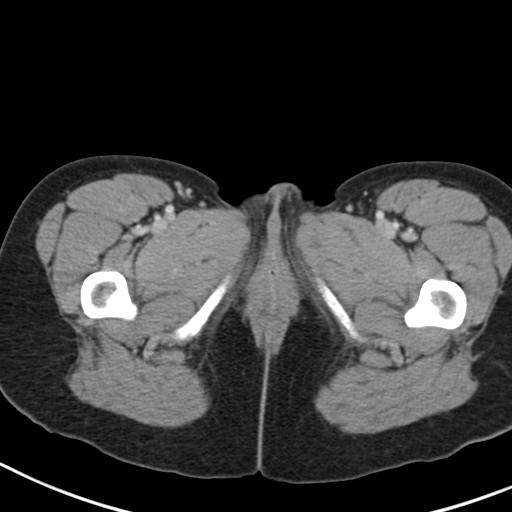
[im 4/85  bone]
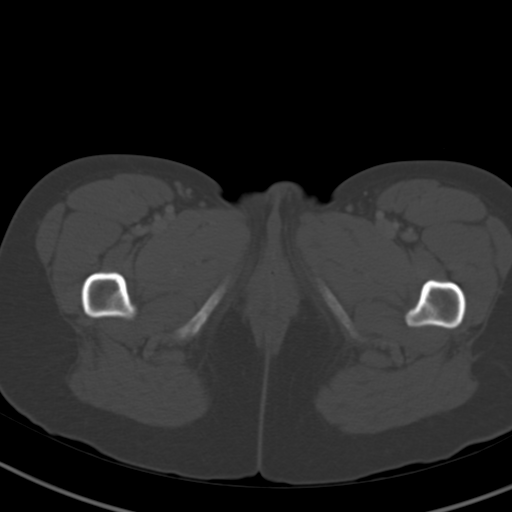
[im 11/85  soft-tissue]
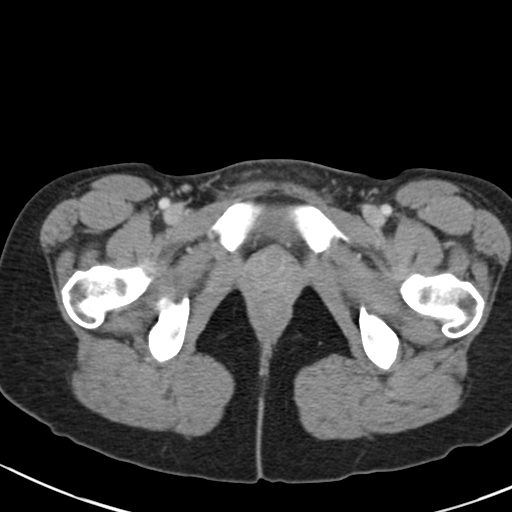
[im 18/85  soft-tissue]
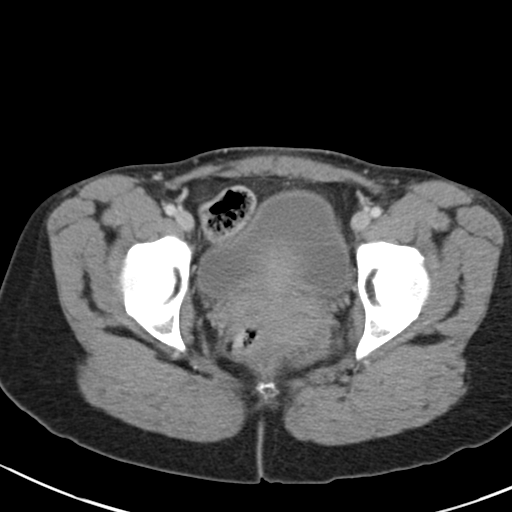
[im 25/85  soft-tissue]
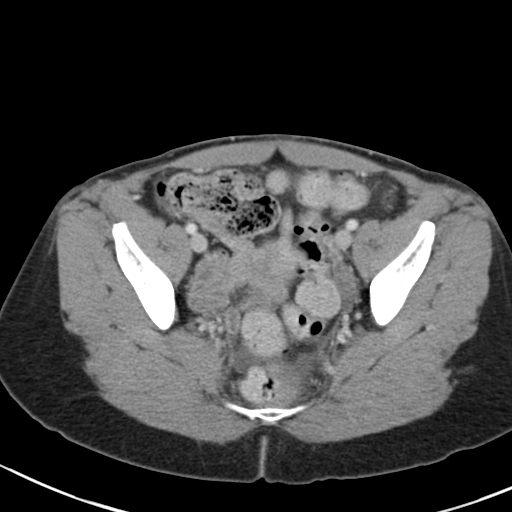
[im 29/85  soft-tissue]
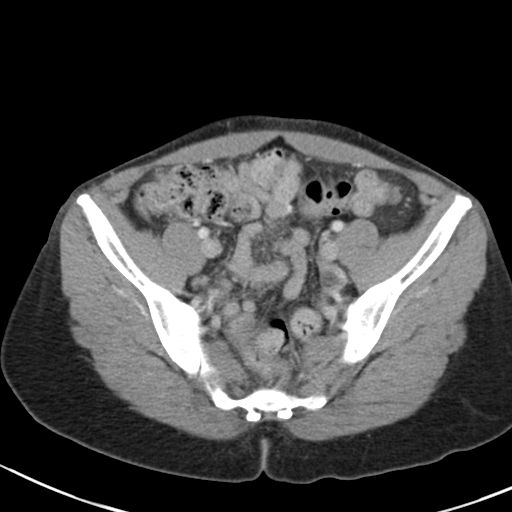
[im 36/85  soft-tissue]
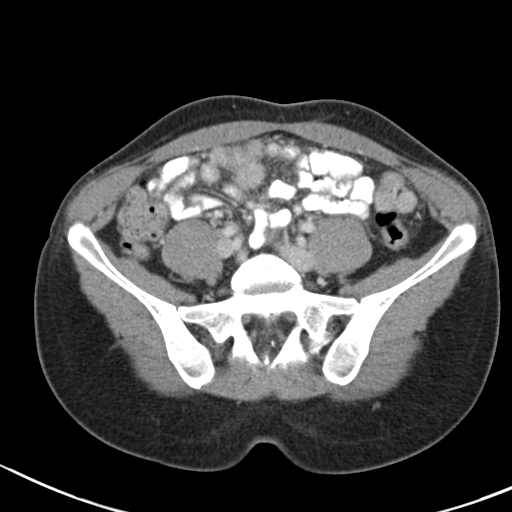
[im 43/85  soft-tissue]
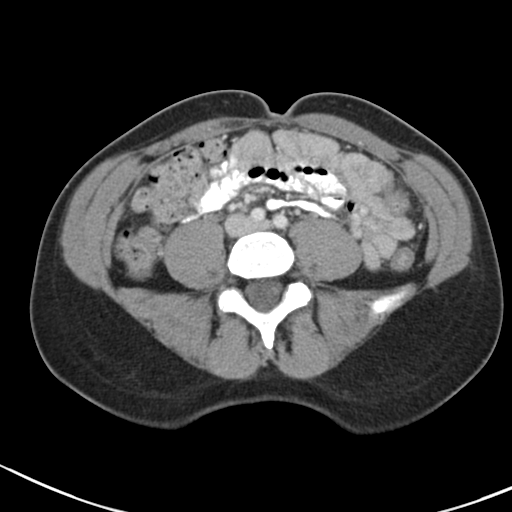
[im 50/85  soft-tissue]
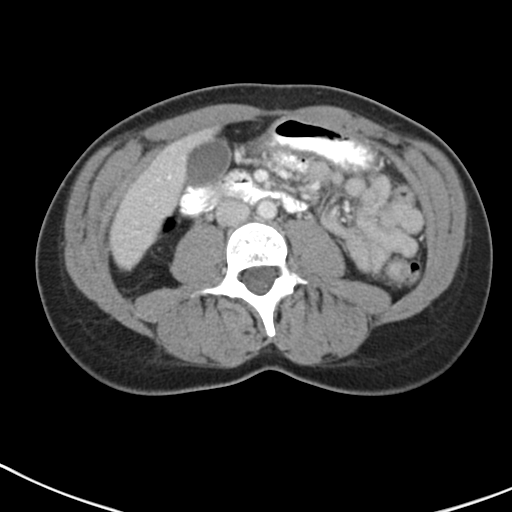
[im 57/85  soft-tissue]
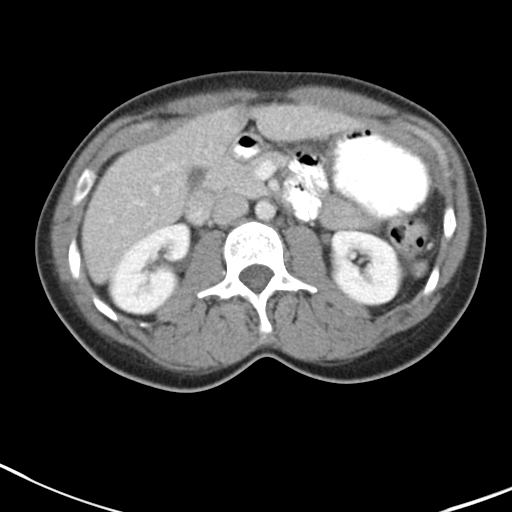
[im 57/85  bone]
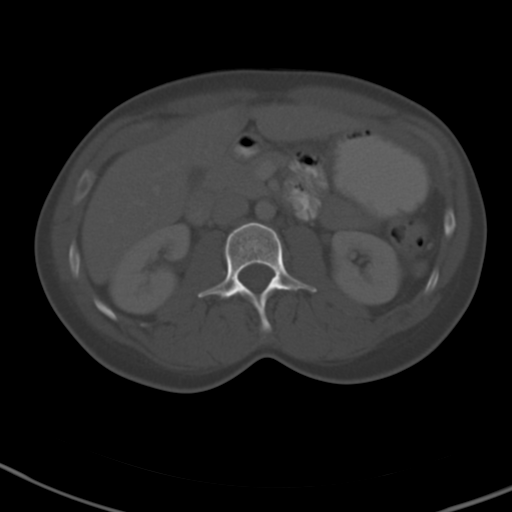
[im 60/85  soft-tissue]
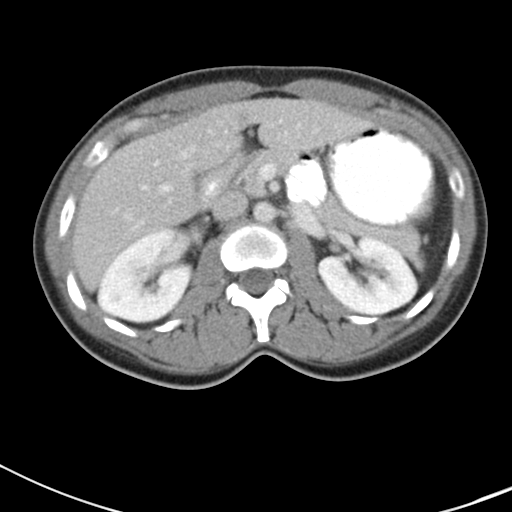
[im 67/85  soft-tissue]
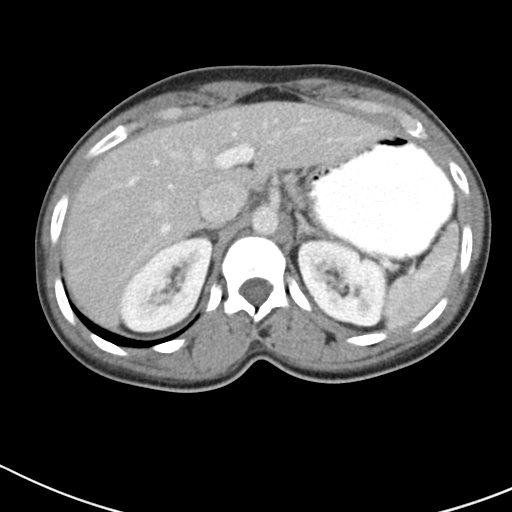
[im 74/85  soft-tissue]
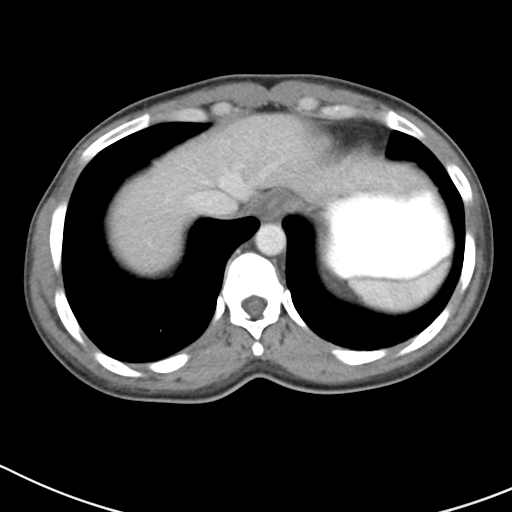
[im 81/85  soft-tissue]
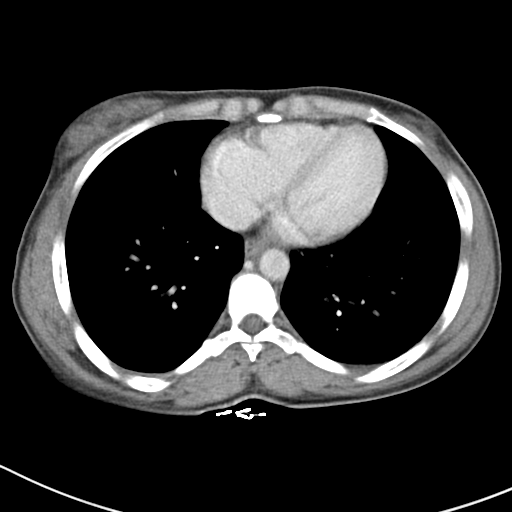

[Series 5: coronal a/|p · coronal · 0.59mm/px · 3 of 71 slices shown]
[im 24/71  soft-tissue]
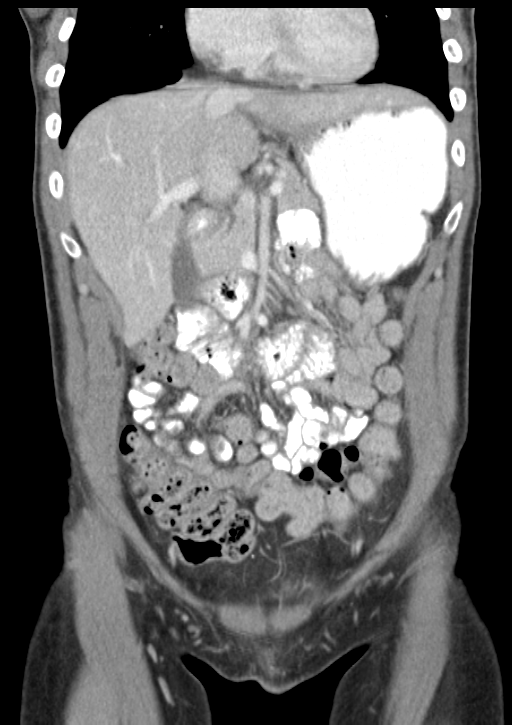
[im 32/71  soft-tissue]
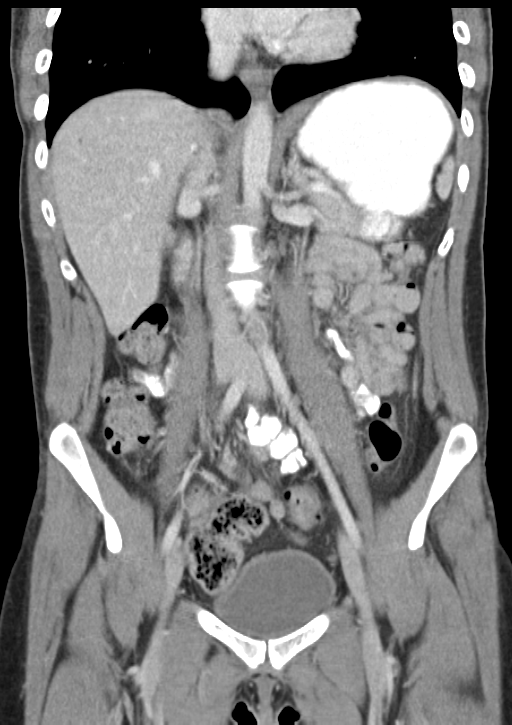
[im 39/71  soft-tissue]
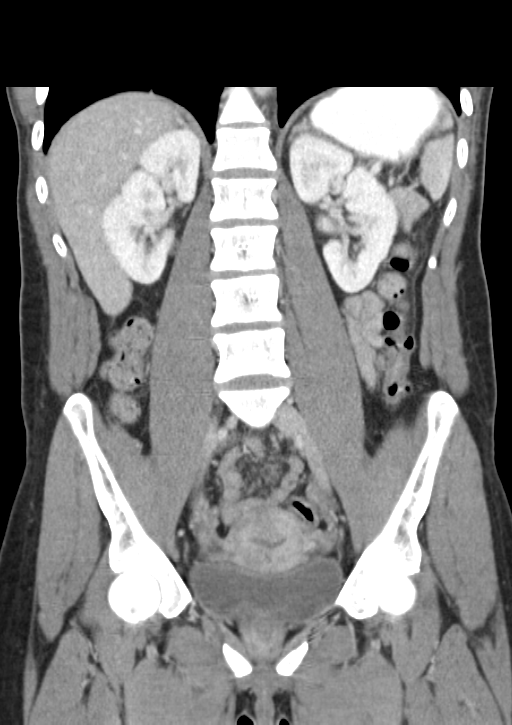

[16 of 46 positions shown; findings below may reference images not displayed]

FINDINGS: Lower chest:  negative

Hepatobiliary: negative

Pancreas: negative

Spleen: negative

Adrenals/Urinary Tract: negative

Stomach/Bowel: Mild diverticulosis of the distal colon. Stomach and
small bowel are normal. Appendix not visualized. There is no
inflammatory change in the anticipated region of the appendix.

Vascular/Lymphatic: negative

Reproductive: 12 mm low-attenuation lesion left ovary with thin
peripheral enhancement.

Other: Physiologic volume of free fluid in the cul-de-sac of the
pelvis

Musculoskeletal: negative
IMPRESSION: Findings suggest small hemorrhagic cyst left ovary with associated
free fluid in the pelvis. Consider pelvic ultrasound to confirm.

## 2017-10-07 ENCOUNTER — Encounter (HOSPITAL_BASED_OUTPATIENT_CLINIC_OR_DEPARTMENT_OTHER): Payer: Self-pay | Admitting: *Deleted

## 2017-10-07 ENCOUNTER — Other Ambulatory Visit: Payer: Self-pay

## 2017-10-07 DIAGNOSIS — F17210 Nicotine dependence, cigarettes, uncomplicated: Secondary | ICD-10-CM | POA: Insufficient documentation

## 2017-10-07 DIAGNOSIS — Z79899 Other long term (current) drug therapy: Secondary | ICD-10-CM | POA: Insufficient documentation

## 2017-10-07 DIAGNOSIS — J111 Influenza due to unidentified influenza virus with other respiratory manifestations: Secondary | ICD-10-CM | POA: Insufficient documentation

## 2017-10-07 NOTE — ED Triage Notes (Signed)
Pt c/o generalized bodyaches and chills x 12 hrs

## 2017-10-08 ENCOUNTER — Emergency Department (HOSPITAL_BASED_OUTPATIENT_CLINIC_OR_DEPARTMENT_OTHER)
Admission: EM | Admit: 2017-10-08 | Discharge: 2017-10-08 | Disposition: A | Discharge status: Home / Self Care | Payer: Self-pay | Attending: Emergency Medicine | Admitting: Emergency Medicine

## 2017-10-08 DIAGNOSIS — R69 Illness, unspecified: Secondary | ICD-10-CM

## 2017-10-08 DIAGNOSIS — J111 Influenza due to unidentified influenza virus with other respiratory manifestations: Secondary | ICD-10-CM

## 2017-10-08 LAB — URINALYSIS, ROUTINE W REFLEX MICROSCOPIC
Bilirubin Urine: NEGATIVE
Glucose, UA: NEGATIVE mg/dL
Hgb urine dipstick: NEGATIVE
Ketones, ur: NEGATIVE mg/dL
NITRITE: NEGATIVE
PROTEIN: NEGATIVE mg/dL
Specific Gravity, Urine: 1.01 (ref 1.005–1.030)
pH: 7 (ref 5.0–8.0)

## 2017-10-08 LAB — URINALYSIS, MICROSCOPIC (REFLEX)

## 2017-10-08 LAB — PREGNANCY, URINE: PREG TEST UR: NEGATIVE

## 2017-10-08 LAB — RAPID STREP SCREEN (MED CTR MEBANE ONLY): Streptococcus, Group A Screen (Direct): NEGATIVE
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
## 2017-10-08 NOTE — Discharge Instructions (Addendum)
Rest, drink plenty of fluids.  Take acetaminophen and/or ibuprofen as needed for fever or achiness.  If symptoms seem to be getting worse, return for reevaluation.

## 2017-10-10 LAB — CULTURE, GROUP A STREP (THRC)

## 2017-12-04 ENCOUNTER — Emergency Department (HOSPITAL_COMMUNITY)
Admission: EM | Admit: 2017-12-04 | Discharge: 2017-12-04 | Disposition: A | Discharge status: Home / Self Care | Payer: BLUE CROSS/BLUE SHIELD | Attending: Emergency Medicine | Admitting: Emergency Medicine

## 2017-12-04 ENCOUNTER — Emergency Department (HOSPITAL_COMMUNITY): Payer: BLUE CROSS/BLUE SHIELD

## 2017-12-04 ENCOUNTER — Other Ambulatory Visit: Payer: Self-pay

## 2017-12-04 ENCOUNTER — Encounter (HOSPITAL_COMMUNITY): Payer: Self-pay | Admitting: Emergency Medicine

## 2017-12-04 DIAGNOSIS — F17210 Nicotine dependence, cigarettes, uncomplicated: Secondary | ICD-10-CM | POA: Diagnosis not present

## 2017-12-04 DIAGNOSIS — R112 Nausea with vomiting, unspecified: Secondary | ICD-10-CM | POA: Insufficient documentation

## 2017-12-04 DIAGNOSIS — R1030 Lower abdominal pain, unspecified: Secondary | ICD-10-CM | POA: Diagnosis not present

## 2017-12-04 LAB — URINALYSIS, ROUTINE W REFLEX MICROSCOPIC
Bilirubin Urine: NEGATIVE
GLUCOSE, UA: NEGATIVE mg/dL
KETONES UR: 5 mg/dL — AB
Leukocytes, UA: NEGATIVE
NITRITE: NEGATIVE
PROTEIN: 30 mg/dL — AB
RBC / HPF: >50 RBC/hpf — ABNORMAL HIGH (ref 0–5)
Specific Gravity, Urine: 1.013 (ref 1.005–1.030)
pH: 6 (ref 5.0–8.0)

## 2017-12-04 LAB — CBC
HCT: 37.3 % (ref 36.0–46.0)
HEMOGLOBIN: 12.5 g/dL (ref 12.0–15.0)
MCH: 26.8 pg (ref 26.0–34.0)
MCHC: 33.5 g/dL (ref 30.0–36.0)
MCV: 79.9 fL (ref 78.0–100.0)
Platelets: 239 10*3/uL (ref 150–400)
RBC: 4.67 MIL/uL (ref 3.87–5.11)
RDW: 16.5 % — ABNORMAL HIGH (ref 11.5–15.5)
WBC: 6.8 10*3/uL (ref 4.0–10.5)

## 2017-12-04 LAB — COMPREHENSIVE METABOLIC PANEL
ALBUMIN: 4.1 g/dL (ref 3.5–5.0)
ALT: 17 U/L (ref 0–44)
ANION GAP: 11 (ref 5–15)
AST: 29 U/L (ref 15–41)
Alkaline Phosphatase: 70 U/L (ref 38–126)
BUN: 7 mg/dL (ref 6–20)
CALCIUM: 8.9 mg/dL (ref 8.9–10.3)
CO2: 21 mmol/L — AB (ref 22–32)
Chloride: 107 mmol/L (ref 98–111)
Creatinine, Ser: 0.56 mg/dL (ref 0.44–1.00)
GFR calc non Af Amer: >60 mL/min (ref >60)
GLUCOSE: 113 mg/dL — AB (ref 70–99)
POTASSIUM: 3.5 mmol/L (ref 3.5–5.1)
SODIUM: 139 mmol/L (ref 135–145)
Total Bilirubin: 0.5 mg/dL (ref 0.3–1.2)
Total Protein: 7.7 g/dL (ref 6.5–8.1)

## 2017-12-04 LAB — I-STAT BETA HCG BLOOD, ED (MC, WL, AP ONLY)

## 2017-12-04 LAB — LIPASE, BLOOD: LIPASE: 26 U/L (ref 11–51)

## 2017-12-04 MED ORDER — SODIUM CHLORIDE 0.9 % IV BOLUS
1000.0000 mL | Freq: Once | INTRAVENOUS | Status: AC
  Administered 2017-12-04: 1000 mL via INTRAVENOUS

## 2017-12-04 MED ORDER — IOPAMIDOL (ISOVUE-300) INJECTION 61%
30.0000 mL | Freq: Once | INTRAVENOUS | Status: AC | PRN
  Administered 2017-12-04: 30 mL via ORAL

## 2017-12-04 MED ORDER — SODIUM CHLORIDE 0.9 % IV SOLN: INTRAVENOUS | Status: DC

## 2017-12-04 MED ORDER — PROMETHAZINE HCL 25 MG PO TABS: 25.0000 mg | ORAL_TABLET | Freq: Four times a day (QID) | ORAL | 1 refills | Status: DC | PRN

## 2017-12-04 MED ORDER — ONDANSETRON HCL 4 MG/2ML IJ SOLN
4.0000 mg | Freq: Once | INTRAMUSCULAR | Status: AC
  Administered 2017-12-04: 4 mg via INTRAVENOUS
  Filled 2017-12-04: qty 2

## 2017-12-04 MED ORDER — HYDROMORPHONE HCL 1 MG/ML IJ SOLN
1.0000 mg | Freq: Once | INTRAMUSCULAR | Status: AC
  Administered 2017-12-04: 1 mg via INTRAVENOUS
  Filled 2017-12-04: qty 1

## 2017-12-04 MED ORDER — HYDROCODONE-ACETAMINOPHEN 5-325 MG PO TABS: 1.0000 | ORAL_TABLET | Freq: Four times a day (QID) | ORAL | 0 refills | Status: DC | PRN

## 2017-12-04 MED ORDER — IOPAMIDOL (ISOVUE-300) INJECTION 61%
INTRAVENOUS | Status: AC
  Filled 2017-12-04: qty 100

## 2017-12-04 MED ORDER — IOPAMIDOL (ISOVUE-300) INJECTION 61%
100.0000 mL | Freq: Once | INTRAVENOUS | Status: AC | PRN
  Administered 2017-12-04: 100 mL via INTRAVENOUS

## 2017-12-04 NOTE — ED Notes (Signed)
ED Provider at bedside. 

## 2017-12-04 NOTE — Discharge Instructions (Addendum)
Follow-up with [facility] for further evaluation of the uterine findings on CT scan.  Take the Phenergan as needed for nausea and vomiting.  Take the hydrocodone as needed for pain.  Return for any new or worse symptoms.

## 2017-12-04 NOTE — ED Notes (Signed)
Pt has been informed that a urine specimen will have to be obtained.
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
## 2017-12-04 NOTE — ED Triage Notes (Addendum)
Per EMS, patient coming from home, c/o generalized body aches with chills and N/V today.   Patient actively vomiting in triage.   18g L AC 4mg  Zofran and 578ml NS with EMS   BP 108/66 HR 68 RR 16 CBG 116

## 2018-03-13 ENCOUNTER — Other Ambulatory Visit: Payer: Self-pay

## 2018-03-13 ENCOUNTER — Emergency Department (HOSPITAL_BASED_OUTPATIENT_CLINIC_OR_DEPARTMENT_OTHER)
Admission: EM | Admit: 2018-03-13 | Discharge: 2018-03-13 | Disposition: A | Discharge status: Home / Self Care | Payer: BLUE CROSS/BLUE SHIELD | Attending: Emergency Medicine | Admitting: Emergency Medicine

## 2018-03-13 ENCOUNTER — Encounter (HOSPITAL_BASED_OUTPATIENT_CLINIC_OR_DEPARTMENT_OTHER): Payer: Self-pay | Admitting: *Deleted

## 2018-03-13 DIAGNOSIS — F17210 Nicotine dependence, cigarettes, uncomplicated: Secondary | ICD-10-CM | POA: Insufficient documentation

## 2018-03-13 DIAGNOSIS — H00024 Hordeolum internum left upper eyelid: Secondary | ICD-10-CM | POA: Insufficient documentation

## 2018-03-13 DIAGNOSIS — Z79899 Other long term (current) drug therapy: Secondary | ICD-10-CM | POA: Insufficient documentation

## 2018-03-13 DIAGNOSIS — R51 Headache: Secondary | ICD-10-CM | POA: Diagnosis present

## 2018-03-13 LAB — PREGNANCY, URINE: Preg Test, Ur: NEGATIVE

## 2018-03-13 MED ORDER — TETRACAINE HCL 0.5 % OP SOLN
1.0000 [drp] | Freq: Once | OPHTHALMIC | Status: AC
  Administered 2018-03-13: 1 [drp] via OPHTHALMIC
  Filled 2018-03-13: qty 4

## 2018-03-13 MED ORDER — FLUORESCEIN SODIUM 1 MG OP STRP
1.0000 | ORAL_STRIP | Freq: Once | OPHTHALMIC | Status: AC
Start: 2018-03-13 — End: 2018-03-13
  Administered 2018-03-13: 1 via OPHTHALMIC
  Filled 2018-03-13: qty 1

## 2018-03-13 MED ORDER — DOXYCYCLINE HYCLATE 100 MG PO CAPS: 100.0000 mg | ORAL_CAPSULE | Freq: Every day | ORAL | 0 refills | Status: AC

## 2018-03-13 MED ORDER — DOXYCYCLINE HYCLATE 100 MG PO CAPS: 100.0000 mg | ORAL_CAPSULE | Freq: Every day | ORAL | 0 refills | Status: DC

## 2018-03-13 MED ORDER — NEOMYCIN-POLYMYXIN-DEXAMETH 3.5-10000-0.1 OP SUSP: 1.0000 [drp] | Freq: Four times a day (QID) | OPHTHALMIC | 0 refills | Status: DC

## 2018-03-13 MED ORDER — NEOMYCIN-POLYMYXIN-DEXAMETH 3.5-10000-0.1 OP SUSP: 1.0000 [drp] | Freq: Four times a day (QID) | OPHTHALMIC | 0 refills | Status: AC

## 2018-03-13 MED FILL — DOXYCYCLINE HYCLATE 100 MG: 100 | 7 days supply | Qty: 7 | Fill #0

## 2018-03-13 MED FILL — NEO/POLY/DEXAMET EYE OINT: 3.5-10000-0 | 7 days supply | Qty: 4 | Fill #0
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
## 2018-03-13 NOTE — Discharge Instructions (Signed)
Apply warm compresses as directed.   Use the Maxitrol eye drops as directed.   Take antibiotics as directed. Please take all of your antibiotics until finished.  As we discussed, please go to Dr. Prudencio Burly office tomorrow at 1:15 PM for an appointment.  Return to the

## 2018-03-13 NOTE — ED Triage Notes (Signed)
Swelling, pain and itching to her left eye x 1 week.

## 2018-03-25 ENCOUNTER — Ambulatory Visit (INDEPENDENT_AMBULATORY_CARE_PROVIDER_SITE_OTHER): Payer: BLUE CROSS/BLUE SHIELD | Admitting: Family Medicine

## 2018-03-25 ENCOUNTER — Other Ambulatory Visit: Payer: Self-pay

## 2018-03-25 ENCOUNTER — Encounter: Payer: Self-pay | Admitting: Family Medicine

## 2018-03-25 VITALS — BP 112/68 | HR 64 | Temp 97.7°F | Resp 17 | Ht 65.0 in | Wt 117.0 lb

## 2018-03-25 DIAGNOSIS — Z0001 Encounter for general adult medical examination with abnormal findings: Secondary | ICD-10-CM | POA: Diagnosis not present

## 2018-03-25 DIAGNOSIS — Z124 Encounter for screening for malignant neoplasm of cervix: Secondary | ICD-10-CM | POA: Diagnosis not present

## 2018-03-25 DIAGNOSIS — N926 Irregular menstruation, unspecified: Secondary | ICD-10-CM | POA: Diagnosis not present

## 2018-03-25 DIAGNOSIS — Z0000 Encounter for general adult medical examination without abnormal findings: Secondary | ICD-10-CM

## 2018-03-25 DIAGNOSIS — N979 Female infertility, unspecified: Secondary | ICD-10-CM | POA: Diagnosis not present

## 2018-03-25 DIAGNOSIS — Z113 Encounter for screening for infections with a predominantly sexual mode of transmission: Secondary | ICD-10-CM | POA: Diagnosis not present

## 2018-03-25 DIAGNOSIS — F17200 Nicotine dependence, unspecified, uncomplicated: Secondary | ICD-10-CM

## 2018-03-25 DIAGNOSIS — N946 Dysmenorrhea, unspecified: Secondary | ICD-10-CM

## 2018-03-25 DIAGNOSIS — L409 Psoriasis, unspecified: Secondary | ICD-10-CM

## 2018-03-25 HISTORY — DX: Dysmenorrhea, unspecified: N94.6

## 2018-03-25 HISTORY — DX: Irregular menstruation, unspecified: N92.6

## 2018-03-25 NOTE — Patient Instructions (Addendum)
If you have lab work done today you will be contacted with your lab results within the next 2 weeks.  If you have not heard from [place] then please contact us. The fastest way to get your results is to register for My Chart.   IF you received an x-ray today, you will receive an invoice from [facility]. Please contact [facility] at [phone] with questions or concerns regarding your invoice.   IF you received labwork today, you will receive an invoice from [facility]. Please contact [facility] at [phone] with questions or concerns regarding your invoice.   Our billing staff will not be able to assist you with questions regarding bills from these companies.  You will be contacted with the lab results as soon as they are available. The fastest way to get your results is to activate your My Chart account. Instructions are located on the last page of this paperwork. If you have not heard from [place] regarding the results in 2 weeks, please contact this office.    Infertility Infertility is when you are unable to get pregnant (conceive) after a year of having sex regularly without using birth control. Infertility can also mean that a woman is not able to carry a pregnancy to full term. Both women and men can have fertility problems. What causes infertility? What Causes Infertility in Women? There are many possible causes of infertility in women. For some women, the cause of infertility is not known (unexplained infertility). Infertility can also be linked to more than one cause. Infertility problems in women can be caused by problems with the menstrual cycle or reproductive organs, certain medical conditions, and factors related to lifestyle and age.  Problems with your menstrual cycle can interfere with your ovaries producing eggs (ovulation). This can make it difficult to get pregnant. This includes having a menstrual cycle that is very long, very short, or  irregular.  Problems with reproductive organs can include: ? An abnormally narrow cervix or a cervix that does not remain closed during a pregnancy. ? A blockage in your fallopian tubes. ? An abnormally shaped uterus. ? Uterine fibroids. This is a tissue mass (tumor) that can develop on your uterus.  Medical conditions that can affect a woman's fertility include: ? Polycystic ovarian syndrome (PCOS). This is a hormonal disorder that can cause small cysts to grow on your ovaries. This is the most common cause of infertility in women. ? Endometriosis. This is a condition in which the tissue that lines your uterus (endometrium) grows outside of its normal location. ? Primary ovary insufficiency. This is when your ovaries stop producing eggs and hormones before the age of 77. ? Sexually transmitted diseases, such as chlamydia or gonorrhea. These infections can cause scarring in your fallopian tubes. This makes it difficult for eggs to reach your uterus. ? Autoimmune disorders. These are disorders in which your immune system attacks normal, healthy cells. ? Hormone imbalances.  Other factors include: ? Age. A woman's fertility declines with age, especially after her mid-45s. ? Being under- or overweight. ? Drinking too much alcohol. ? Using drugs. ? Exercising excessively. ? Being exposed to environmental toxins, such as radiation, pesticides, and certain chemicals.  What Causes Infertility in Men? There are many causes of infertility in men. Infertility can be linked to more than one cause. Infertility problems in men can be caused by problems with sperm or the reproductive organs, certain medical conditions, and factors related to lifestyle and age. Some  men have unexplained infertility.  Problems with sperm. Infertility can result if there is a problem producing: ? Enough sperm (low sperm count). ? Enough normally-shaped sperm (sperm morphology). ? Sperm that are able to reach the egg  (poor motility).  Infertility can also be caused by: ? A problem with hormones. ? Enlarged veins (varicoceles), cysts (spermatoceles), or tumors of the testicles. ? Sexual dysfunction. ? Injury to the testicles. ? A birth defect, such as not having the tubes that carry sperm (vas deferens).  Medical conditions that can affect a man's fertility include: ? Diabetes. ? Cancer treatments, such as chemotherapy or radiation. ? Klinefelter syndrome. This is an inherited genetic disorder. ? Thyroid problems, such as an under- or overactive thyroid. ? Cystic fibrosis. ? Sexually transmitted diseases.  Other factors include: ? Age. A man's fertility declines with age. ? Drinking too much alcohol. ? Using drugs. ? Being exposed to environmental toxins, such as pesticides and lead.  What are the symptoms of infertility? Being unable to get pregnant after one year of having regular sex without using birth control is the only sign of infertility. How is infertility diagnosed? In order to be diagnosed with infertility, both partners will have a physical exam. Both partners will also have an extensive medical and sexual history taken. If there is no obvious reason for infertility, additional tests may be done. What Tests Will Women Have? Women may first have tests to check whether they are ovulating each month. The tests may include:  Blood tests to check hormone levels.  An ultrasound of the ovaries. This looks for possible problems on or in the ovaries.  Taking a small sample of the tissue that lines the uterus for examination under a microscope (endometrial biopsy).  Women who are ovulating may have additional tests. These may include:  Hysterosalpingography. ? This is an X-ray of the fallopian tubes and uterus taken after a specific type of dye is injected. ? This test can show the shape of the uterus and whether the fallopian tubes are open.  Laparoscopy. ? In this test, a lighted  tube (laparoscope) is used to look for problems in the fallopian tubes and other female organs.  Transvaginal ultrasound. ? This is an imaging test to check for abnormalities of the uterus and ovaries. ? A health care provider can use this test to count the number of follicles on the ovaries.  Hysteroscopy. ? This test involves using a lighted tube to examine the cervix and inside the uterus. ? It is done to find any abnormalities inside the uterus.  What Tests Will Men Have? Tests for men's infertility includes:  Semen tests to check sperm count, morphology, and motility.  Blood tests to check for hormone levels.  Taking a small sample of tissue from inside a testicle (biopsy). This is examined under a microscope.  Blood tests to check for genetic abnormalities (genetic testing).  How are women treated for infertility? Treatment depends on the cause of infertility. Most cases of infertility in women are treated with medicine or surgery.  Women may take medicine to: ? Correct ovulation problems. ? Treat other health conditions, such as PCOS.  Surgery may be done to: ? Repair damage to the ovaries, fallopian tubes, cervix, or uterus. ? Remove growths from the uterus. ? Remove scar tissue from the uterus, pelvis, or other female organs.  How are men treated for infertility? Treatment depends on the cause of infertility. Most cases of infertility in men are treated with  medicine or surgery.  Men may take medicine to: ? Correct hormone problems. ? Treat other health conditions. ? Treat sexual dysfunction.  Surgery may be done to: ? Remove blockages in the reproductive tract. ? Correct other structural problems of the reproductive tract.  What is assisted reproductive technology? Assisted reproductive technology (ART) refers to all treatments and procedures that combine eggs and sperm outside the body to try to help a couple conceive. ART is often combined with fertility  drugs to stimulate ovulation. Sometimes ART is done using eggs retrieved from another woman's body (donor eggs) or from previously frozen fertilized eggs (embryos). There are different types of ART. These include:  Intrauterine insemination (IUI). ? In this procedure, sperm is placed directly into a woman's uterus with a long, thin tube. ? This may be most effective for infertility caused by sperm problems, including low sperm count and low motility. ? Can be used in combination with fertility drugs.  In vitro fertilization (IVF). ? This is often done when a woman's fallopian tubes are blocked or when a man has low sperm counts. ? Fertility drugs stimulate the ovaries to produce multiple eggs. Once mature, these eggs are removed from the body and combined with the sperm to be fertilized. ? These fertilized eggs are then placed in the woman's uterus.  This information is not intended to replace advice given to you by your health care provider. Make sure you discuss any questions you have with your health care provider. Document Released: 04/19/2003 Document Revised: 09/16/2015 Document Reviewed: 12/30/2013 Elsevier Interactive Patient Education  [employer]. Infertility Infertility is when you are unable to get pregnant (conceive) after a year of having sex regularly without using birth control. Infertility can also mean that a woman is not able to carry a pregnancy to full term. Both women and men can have fertility problems. What causes infertility? What Causes Infertility in Women? There are many possible causes of infertility in women. For some women, the cause of infertility is not known (unexplained infertility). Infertility can also be linked to more than one cause. Infertility problems in women can be caused by problems with the menstrual cycle or reproductive organs, certain medical conditions, and factors related to lifestyle and age.  Problems with your menstrual cycle can  interfere with your ovaries producing eggs (ovulation). This can make it difficult to get pregnant. This includes having a menstrual cycle that is very long, very short, or irregular.  Problems with reproductive organs can include: ? An abnormally narrow cervix or a cervix that does not remain closed during a pregnancy. ? A blockage in your fallopian tubes. ? An abnormally shaped uterus. ? Uterine fibroids. This is a tissue mass (tumor) that can develop on your uterus.  Medical conditions that can affect a woman's fertility include: ? Polycystic ovarian syndrome (PCOS). This is a hormonal disorder that can cause small cysts to grow on your ovaries. This is the most common cause of infertility in women. ? Endometriosis. This is a condition in which the tissue that lines your uterus (endometrium) grows outside of its normal location. ? Primary ovary insufficiency. This is when your ovaries stop producing eggs and hormones before the age of 68. ? Sexually transmitted diseases, such as chlamydia or gonorrhea. These infections can cause scarring in your fallopian tubes. This makes it difficult for eggs to reach your uterus. ? Autoimmune disorders. These are disorders in which your immune system attacks normal, healthy cells. ? Hormone imbalances.  Other  factors include: ? Age. A woman's fertility declines with age, especially after her mid-73s. ? Being under- or overweight. ? Drinking too much alcohol. ? Using drugs. ? Exercising excessively. ? Being exposed to environmental toxins, such as radiation, pesticides, and certain chemicals.  What Causes Infertility in Men? There are many causes of infertility in men. Infertility can be linked to more than one cause. Infertility problems in men can be caused by problems with sperm or the reproductive organs, certain medical conditions, and factors related to lifestyle and age. Some men have unexplained infertility.  Problems with sperm. Infertility  can result if there is a problem producing: ? Enough sperm (low sperm count). ? Enough normally-shaped sperm (sperm morphology). ? Sperm that are able to reach the egg (poor motility).  Infertility can also be caused by: ? A problem with hormones. ? Enlarged veins (varicoceles), cysts (spermatoceles), or tumors of the testicles. ? Sexual dysfunction. ? Injury to the testicles. ? A birth defect, such as not having the tubes that carry sperm (vas deferens).  Medical conditions that can affect a man's fertility include: ? Diabetes. ? Cancer treatments, such as chemotherapy or radiation. ? Klinefelter syndrome. This is an inherited genetic disorder. ? Thyroid problems, such as an under- or overactive thyroid. ? Cystic fibrosis. ? Sexually transmitted diseases.  Other factors include: ? Age. A man's fertility declines with age. ? Drinking too much alcohol. ? Using drugs. ? Being exposed to environmental toxins, such as pesticides and lead.  What are the symptoms of infertility? Being unable to get pregnant after one year of having regular sex without using birth control is the only sign of infertility. How is infertility diagnosed? In order to be diagnosed with infertility, both partners will have a physical exam. Both partners will also have an extensive medical and sexual history taken. If there is no obvious reason for infertility, additional tests may be done. What Tests Will Women Have? Women may first have tests to check whether they are ovulating each month. The tests may include:  Blood tests to check hormone levels.  An ultrasound of the ovaries. This looks for possible problems on or in the ovaries.  Taking a small sample of the tissue that lines the uterus for examination under a microscope (endometrial biopsy).  Women who are ovulating may have additional tests. These may include:  Hysterosalpingography. ? This is an X-ray of the fallopian tubes and uterus taken after  a specific type of dye is injected. ? This test can show the shape of the uterus and whether the fallopian tubes are open.  Laparoscopy. ? In this test, a lighted tube (laparoscope) is used to look for problems in the fallopian tubes and other female organs.  Transvaginal ultrasound. ? This is an imaging test to check for abnormalities of the uterus and ovaries. ? A health care provider can use this test to count the number of follicles on the ovaries.  Hysteroscopy. ? This test involves using a lighted tube to examine the cervix and inside the uterus. ? It is done to find any abnormalities inside the uterus.  What Tests Will Men Have? Tests for men's infertility includes:  Semen tests to check sperm count, morphology, and motility.  Blood tests to check for hormone levels.  Taking a small sample of tissue from inside a testicle (biopsy). This is examined under a microscope.  Blood tests to check for genetic abnormalities (genetic testing).  How are women treated for infertility? Treatment depends on  the cause of infertility. Most cases of infertility in women are treated with medicine or surgery.  Women may take medicine to: ? Correct ovulation problems. ? Treat other health conditions, such as PCOS.  Surgery may be done to: ? Repair damage to the ovaries, fallopian tubes, cervix, or uterus. ? Remove growths from the uterus. ? Remove scar tissue from the uterus, pelvis, or other female organs.  How are men treated for infertility? Treatment depends on the cause of infertility. Most cases of infertility in men are treated with medicine or surgery.  Men may take medicine to: ? Correct hormone problems. ? Treat other health conditions. ? Treat sexual dysfunction.  Surgery may be done to: ? Remove blockages in the reproductive tract. ? Correct other structural problems of the reproductive tract.  What is assisted reproductive technology? Assisted reproductive technology  (ART) refers to all treatments and procedures that combine eggs and sperm outside the body to try to help a couple conceive. ART is often combined with fertility drugs to stimulate ovulation. Sometimes ART is done using eggs retrieved from another woman's body (donor eggs) or from previously frozen fertilized eggs (embryos). There are different types of ART. These include:  Intrauterine insemination (IUI). ? In this procedure, sperm is placed directly into a woman's uterus with a long, thin tube. ? This may be most effective for infertility caused by sperm problems, including low sperm count and low motility. ? Can be used in combination with fertility drugs.  In vitro fertilization (IVF). ? This is often done when a woman's fallopian tubes are blocked or when a man has low sperm counts. ? Fertility drugs stimulate the ovaries to produce multiple eggs. Once mature, these eggs are removed from the body and combined with the sperm to be fertilized. ? These fertilized eggs are then placed in the woman's uterus.  This information is not intended to replace advice given to you by your health care provider. Make sure you discuss any questions you have with your health care provider. Document Released: 04/19/2003 Document Revised: 09/16/2015 Document Reviewed: 12/30/2013 Elsevier Interactive Patient Education  2018 [employer].

## 2018-03-25 NOTE — Assessment & Plan Note (Addendum)
Referral for evaluation by Gynecology Checked basic labs today PCOS is a concern given facial hair and irregular menses

## 2018-03-25 NOTE — Assessment & Plan Note (Signed)
Continue current meds until gyne visit
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
## 2018-03-25 NOTE — Assessment & Plan Note (Signed)
Smoking cessation advised especially since smoking can affect ovulation

## 2018-03-25 NOTE — Assessment & Plan Note (Signed)
Discussed follow up for evaluation, continue current monitoring and referral to Gynecology

## 2018-03-26 ENCOUNTER — Encounter: Payer: Self-pay | Admitting: Family Medicine

## 2018-03-26 LAB — COMPREHENSIVE METABOLIC PANEL
ALK PHOS: 97 IU/L (ref 39–117)
ALT: 23 IU/L (ref 0–32)
AST: 38 IU/L (ref 0–40)
Albumin/Globulin Ratio: 1.3 (ref 1.2–2.2)
Albumin: 4.7 g/dL (ref 3.5–5.5)
BILIRUBIN TOTAL: 0.4 mg/dL (ref 0.0–1.2)
BUN/Creatinine Ratio: 14 (ref 9–23)
BUN: 9 mg/dL (ref 6–20)
CHLORIDE: 99 mmol/L (ref 96–106)
CO2: 20 mmol/L (ref 20–29)
Calcium: 10.1 mg/dL (ref 8.7–10.2)
Creatinine, Ser: 0.66 mg/dL (ref 0.57–1.00)
GFR calc Af Amer: 130 mL/min/{1.73_m2} (ref >59)
GFR calc non Af Amer: 112 mL/min/{1.73_m2} (ref >59)
Globulin, Total: 3.5 g/dL (ref 1.5–4.5)
Glucose: 101 mg/dL — ABNORMAL HIGH (ref 65–99)
POTASSIUM: 3.8 mmol/L (ref 3.5–5.2)
Sodium: 138 mmol/L (ref 134–144)
Total Protein: 8.2 g/dL (ref 6.0–8.5)

## 2018-03-26 LAB — HEPATITIS B SURFACE ANTIGEN: Hepatitis B Surface Ag: NEGATIVE

## 2018-03-26 LAB — HIV ANTIBODY (ROUTINE TESTING W REFLEX): HIV Screen 4th Generation wRfx: NONREACTIVE

## 2018-03-26 LAB — HEMOGLOBIN A1C
ESTIMATED AVERAGE GLUCOSE: 108 mg/dL
HEMOGLOBIN A1C: 5.4 % (ref 4.8–5.6)

## 2018-03-26 LAB — TSH+FREE T4
Free T4: 1.05 ng/dL (ref 0.82–1.77)
TSH: 1.01 u[IU]/mL (ref 0.450–4.500)

## 2018-03-26 LAB — RPR: RPR Ser Ql: NONREACTIVE

## 2018-03-31 LAB — PAP IG, CT-NG NAA, HPV HIGH-RISK
Chlamydia, Nuc. Acid Amp: NEGATIVE
Gonococcus by Nucleic Acid Amp: NEGATIVE
HPV, HIGH-RISK: NEGATIVE

## 2018-04-28 ENCOUNTER — Ambulatory Visit (INDEPENDENT_AMBULATORY_CARE_PROVIDER_SITE_OTHER): Payer: BLUE CROSS/BLUE SHIELD | Admitting: Obstetrics & Gynecology

## 2018-04-28 ENCOUNTER — Encounter: Payer: Self-pay | Admitting: Obstetrics & Gynecology

## 2018-04-28 VITALS — BP 108/70 | Ht 64.0 in | Wt 119.0 lb

## 2018-04-28 DIAGNOSIS — N979 Female infertility, unspecified: Secondary | ICD-10-CM

## 2018-04-28 NOTE — Patient Instructions (Signed)
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  Menstrual periods normal and regular every month except for moderate dysmenorrhea.  Primary infertility discussed with patient in the context of advanced maternal age and the risk of tubal obstruction given the history of 3 chlamydial infections.  Patient will come back on day 21 of this cycle for lab work and we will organized hysterosalpingography under [facility] as well as a sperm analysis.  Patient will follow-up when we have all the results to discuss management. - TSH future - Prolactin future - Hemoglobin A1C future - Progesterone future  Christina Cunningham, it was a pleasure meeting you today!

## 2018-04-28 NOTE — Progress Notes (Signed)
    Christina Cunningham [birth year] [id number]        37 y.o.  G0 Stable boyfriend x 6 yrs  RP: Primary infertility  HPI: Menstrual periods every months with normal flow.  Moderate dysmenorrhea.  No pelvic pain otherwise.  Normal vaginal secretions.  No pain with intercourse.  Using an App with fertile days to time intercourse.  No contraception for 6 years with her boyfriend.  Boyfriend has 3 grownup children.  Patient with a history of chlamydia infection x3 in her teens and 51s treated with p.o. antibiotics.  No fertility investigation so far.  Pap test normal per patient in 2019.   OB History  Gravida Para Term Preterm AB Living  0 0 0 0 0 0  SAB TAB Ectopic Multiple Live Births  0 0 0 0      Past medical history,surgical history, problem list, medications, allergies, family history and social history were all reviewed and documented in the EPIC chart.   Directed ROS with pertinent positives and negatives documented in the history of present illness/assessment and plan.  Exam:  Vitals:   04/28/18 0847  BP: 108/70  Weight: 119 lb (54 kg)  Height: 5\' 4"  (1.626 m)   General appearance:  Normal  Abdomen: Normal  Gynecologic exam: Vulva normal.  Bimanual exam: Uterus anteverted, normal volume, mobile, nontender.  No adnexal mass, nontender bilaterally.   Assessment/Plan:  37 y.o. 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  Menstrual periods normal and regular every month except for moderate dysmenorrhea.  Primary infertility discussed with patient in the context of advanced maternal age and the risk of tubal obstruction given the history of 3 chlamydial infections.  Patient will come back on day 21 of this cycle for lab work and we will organized hysterosalpingography under [facility] as well as a sperm analysis.  Patient will follow-up when we have all the results to  discuss management. - TSH future - Prolactin future - Hemoglobin A1C future - Progesterone future  Counseling on above issues and coordination of care more than 50% for 30 minutes.  Princess Bruins MD, 8:57 AM 04/28/2018

## 2018-05-06 ENCOUNTER — Telehealth: Payer: Self-pay | Admitting: *Deleted

## 2018-05-06 DIAGNOSIS — N979 Female infertility, unspecified: Secondary | ICD-10-CM

## 2018-05-06 NOTE — Telephone Encounter (Signed)
-----   Message from Princess Bruins, MD sent at 04/28/2018  9:14 AM EST ----- Regarding: Primary infertility Schedule Hysterosalpingography under [facility].  Sperm analysis.

## 2018-05-06 NOTE — Telephone Encounter (Signed)
I called patient to confirm she was aware to call me on day 1 of cycle to schedule HSG, I also told her I will need her partner's information name, DOB, etc to schedule semen analysis. Patient asked me to hold off on scheduling semen analysis for now. Patient will follow up on day 1 of cycle.

## 2018-05-07 ENCOUNTER — Telehealth: Payer: Self-pay

## 2018-05-07 NOTE — Telephone Encounter (Signed)
Lab letter sent via mail to pt home address. Dgaddy, CMA

## 2018-05-12 ENCOUNTER — Ambulatory Visit (INDEPENDENT_AMBULATORY_CARE_PROVIDER_SITE_OTHER): Payer: BLUE CROSS/BLUE SHIELD | Admitting: Family Medicine

## 2018-05-12 ENCOUNTER — Other Ambulatory Visit: Payer: Self-pay

## 2018-05-12 ENCOUNTER — Encounter: Payer: Self-pay | Admitting: Family Medicine

## 2018-05-12 VITALS — BP 103/63 | HR 73 | Temp 98.9°F | Resp 20 | Ht 64.57 in | Wt 117.4 lb

## 2018-05-12 DIAGNOSIS — L03317 Cellulitis of buttock: Secondary | ICD-10-CM | POA: Diagnosis not present

## 2018-05-12 MED ORDER — CEPHALEXIN 500 MG PO CAPS: 500.0000 mg | ORAL_CAPSULE | Freq: Four times a day (QID) | ORAL | 0 refills | Status: DC

## 2018-05-12 NOTE — Patient Instructions (Addendum)
Warm compresses or warm soaks in the tub at least 4-5 times per day.  Okay to gently press on the area to express pus after the warm compresses or soak.  Start antibiotic 4 times per day.  Recheck in 2 days to decide if incision will be needed.  Sooner if worsening.  Tylenol if needed for pain.  Return to the clinic or go to the nearest emergency room if any of your symptoms worsen or new symptoms occur.   Cellulitis, Adult  Cellulitis is a skin infection. The infected area is usually warm, red, swollen, and tender. This condition occurs most often in the arms and lower legs. The infection can travel to the muscles, blood, and underlying tissue and become serious. It is very important to get treated for this condition. What are the causes? Cellulitis is caused by bacteria. The bacteria enter through a break in the skin, such as a cut, burn, insect bite, open sore, or crack. What increases the risk? This condition is more likely to occur in people who:  Have a weak body defense system (immune system).  Have open wounds on the skin, such as cuts, burns, bites, and scrapes. Bacteria can enter the body through these open wounds.  Are older than 40 years of age.  Have diabetes.  Have a type of long-lasting (chronic) liver disease (cirrhosis) or kidney disease.  Are obese.  Have a skin condition such as: ? Itchy rash (eczema). ? Slow movement of blood in the veins (venous stasis). ? Fluid buildup below the skin (edema).  Have had radiation therapy.  Use IV drugs. What are the signs or symptoms? Symptoms of this condition include:  Redness, streaking, or spotting on the skin.  Swollen area of the skin.  Tenderness or pain when an area of the skin is touched.  Warm skin.  A fever.  Chills.  Blisters. How is this diagnosed? This condition is diagnosed based on a medical history and physical exam. You may also have tests, including:  Blood tests.  Imaging tests. How is  this treated? Treatment for this condition may include:  Medicines, such as antibiotic medicines or medicines to treat allergies (antihistamines).  Supportive care, such as rest and application of cold or warm cloths (compresses) to the skin.  Hospital care, if the condition is severe. The infection usually starts to get better within 1-2 days of treatment. Follow these instructions at home:  Medicines  Take over-the-counter and prescription medicines only as told by your health care provider.  If you were prescribed an antibiotic medicine, take it as told by your health care provider. Do not stop taking the antibiotic even if you start to feel better. General instructions  Drink enough fluid to keep your urine pale yellow.  Do not touch or rub the infected area.  Raise (elevate) the infected area above the level of your heart while you are sitting or lying down.  Apply warm or cold compresses to the affected area as told by your health care provider.  Keep all follow-up visits as told by your health care provider. This is important. These visits let your health care provider make sure a more serious infection is not developing. Contact a health care provider if:  You have a fever.  Your symptoms do not begin to improve within 1-2 days of starting treatment.  Your bone or joint underneath the infected area becomes painful after the skin has healed.  Your infection returns in the same area or  another area.  You notice a swollen bump in the infected area.  You develop new symptoms.  You have a general ill feeling (malaise) with muscle aches and pains. Get help right away if:  Your symptoms get worse.  You feel very sleepy.  You develop vomiting or diarrhea that persists.  You notice red streaks coming from the infected area.  Your red area gets larger or turns dark in color. These symptoms may represent a serious problem that is an emergency. Do not wait to see if  the symptoms will go away. Get medical help right away. Call your local emergency services (911 in the [place] Do not drive yourself to the hospital. Summary  Cellulitis is a skin infection. This condition occurs most often in the arms and lower legs.  Treatment for this condition may include medicines, such as antibiotic medicines or antihistamines.  Take over-the-counter and prescription medicines only as told by your health care provider. If you were prescribed an antibiotic medicine, do not stop taking the antibiotic even if you start to feel better.  Contact a health care provider if your symptoms do not begin to improve within 1-2 days of starting treatment or your symptoms get worse.  Keep all follow-up visits as told by your health care provider. This is important. These visits let your health care provider make sure that a more serious infection is not developing. This information is not intended to replace advice given to you by your health care provider. Make sure you discuss any questions you have with your health care provider. Document Released: 01/24/2005 Document Revised: 09/05/2017 Document Reviewed: 09/05/2017 Elsevier Interactive Patient Education  [employer].   If you have lab work done today you will be contacted with your lab results within the next 2 weeks.  If you have not heard from [place] then please contact us. The fastest way to get your results is to register for My Chart.   IF you received an x-ray today, you will receive an invoice from [facility]. Please contact  [facility] at [phone] with questions or concerns regarding your invoice.   IF you received labwork today, you will receive an invoice from [place]. Please contact [facility] at [phone] with questions or concerns regarding your invoice.   Our billing staff will not be able to assist you with questions regarding bills from these companies.  You will be contacted with the  lab results as soon as they are available. The fastest way to get your results is to activate your My Chart account. Instructions are located on the last page of this paperwork. If you have not heard from [place] regarding the results in 2 weeks, please contact this office.
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
## 2018-05-13 ENCOUNTER — Encounter: Payer: Self-pay | Admitting: Family Medicine

## 2018-05-13 ENCOUNTER — Other Ambulatory Visit: Payer: BLUE CROSS/BLUE SHIELD

## 2018-05-14 ENCOUNTER — Encounter: Payer: Self-pay | Admitting: Family Medicine

## 2018-05-14 ENCOUNTER — Ambulatory Visit (INDEPENDENT_AMBULATORY_CARE_PROVIDER_SITE_OTHER): Payer: BLUE CROSS/BLUE SHIELD | Admitting: Family Medicine

## 2018-05-14 ENCOUNTER — Other Ambulatory Visit: Payer: Self-pay

## 2018-05-14 VITALS — BP 96/60 | HR 78 | Temp 99.2°F | Resp 16 | Ht 64.0 in | Wt 116.2 lb

## 2018-05-14 DIAGNOSIS — R6883 Chills (without fever): Secondary | ICD-10-CM | POA: Diagnosis not present

## 2018-05-14 DIAGNOSIS — L03317 Cellulitis of buttock: Secondary | ICD-10-CM | POA: Diagnosis not present

## 2018-05-14 DIAGNOSIS — J069 Acute upper respiratory infection, unspecified: Secondary | ICD-10-CM | POA: Diagnosis not present

## 2018-05-14 LAB — POCT CBC
Granulocyte percent: 62.7 %G (ref 37–80)
HEMATOCRIT: 38.6 % (ref 29–41)
Hemoglobin: 12.7 g/dL (ref 11–14.6)
LYMPH, POC: 1.7 (ref 0.6–3.4)
MCH, POC: 27.3 pg (ref 27–31.2)
MCHC: 33 g/dL (ref 31.8–35.4)
MCV: 82.7 fL (ref 76–111)
MID (cbc): 0.5 (ref 0–0.9)
MPV: 8.6 fL (ref 0–99.8)
POC GRANULOCYTE: 3.8 (ref 2–6.9)
POC LYMPH %: 28.3 % (ref 10–50)
POC MID %: 9 % (ref 0–12)
Platelet Count, POC: 221 10*3/uL (ref 142–424)
RBC: 4.67 M/uL (ref 4.04–5.48)
RDW, POC: 14 %
WBC: 6 10*3/uL (ref 4.6–10.2)

## 2018-05-14 LAB — HEMOGLOBIN A1C
EAG (MMOL/L): 6 (calc)
Hgb A1c MFr Bld: 5.4 % of total Hgb (ref <5.7)
Mean Plasma Glucose: 108 (calc)

## 2018-05-14 LAB — PROGESTERONE: PROGESTERONE: 6.5 ng/mL

## 2018-05-14 LAB — PROLACTIN: Prolactin: 7.5 ng/mL

## 2018-05-14 LAB — TSH: TSH: 0.29 m[IU]/L — AB

## 2018-05-14 NOTE — Patient Instructions (Addendum)
As the redness has improved, okay to continue antibiotic.  I do still feel a firm area below the surface which could be an abscess.  Okay to try continued warm compresses or soaks with gentle pressure over affected area.  You can cleanse the top of that surface with soap and water to loosen scab if needed.  Recheck in 2 days, and if not improved at that time would recommend trying to drain that area.  Return to the clinic or go to the nearest emergency room if any of your symptoms worsen or new symptoms occur.  Blood count looks okay, I suspect your other symptoms are due to a virus.  See information below.   Upper Respiratory Infection, Adult An upper respiratory infection (URI) affects the nose, throat, and upper air passages. URIs are caused by germs (viruses). The most common type of URI is often called "the common cold." Medicines cannot cure URIs, but you can do things at home to relieve your symptoms. URIs usually get better within 7-10 days. Follow these instructions at home: Activity  Rest as needed.  If you have a fever, stay home from work or school until your fever is gone, or until your doctor says you may return to work or school. ? You should stay home until you cannot spread the infection anymore (you are not contagious). ? Your doctor may have you wear a face mask so you have less risk of spreading the infection. Relieving symptoms  Gargle with a salt-water mixture 3-4 times a day or as needed. To make a salt-water mixture, completely dissolve -1 tsp of salt in 1 cup of warm water.  Use a cool-mist humidifier to add moisture to the air. This can help you breathe more easily. Eating and drinking   Drink enough fluid to keep your pee (urine) pale yellow.  Eat soups and other clear broths. General instructions   Take over-the-counter and prescription medicines only as told by your doctor. These include cold medicines, fever reducers, and cough suppressants.  Do not  use any products that contain nicotine or tobacco. These include cigarettes and e-cigarettes. If you need help quitting, ask your doctor.  Avoid being where people are smoking (avoid secondhand smoke).  Make sure you get regular shots and get the flu shot every year.  Keep all follow-up visits as told by your doctor. This is important. How to avoid spreading infection to others   Wash your hands often with soap and water. If you do not have soap and water, use hand sanitizer.  Avoid touching your mouth, face, eyes, or nose.  Cough or sneeze into a tissue or your sleeve or elbow. Do not cough or sneeze into your hand or into the air. Contact a doctor if:  You are getting worse, not better.  You have any of these: ? A fever. ? Chills. ? Brown or red mucus in your nose. ? Yellow or brown fluid (discharge)coming from your nose. ? Pain in your face, especially when you bend forward. ? Swollen neck glands. ? Pain with swallowing. ? White areas in the back of your throat. Get help right away if:  You have shortness of breath that gets worse.  You have very bad or constant: ? Headache. ? Ear pain. ? Pain in your forehead, behind your eyes, and over your cheekbones (sinus pain). ? Chest pain.  You have long-lasting (chronic) lung disease along with any of these: ? Wheezing. ? Long-lasting cough. ? Coughing up blood. ?  A change in your usual mucus.  You have a stiff neck.  You have changes in your: ? Vision. ? Hearing. ? Thinking. ? Mood. Summary  An upper respiratory infection (URI) is caused by a germ called a virus. The most common type of URI is often called "the common cold."  URIs usually get better within 7-10 days.  Take over-the-counter and prescription medicines only as told by your doctor. This information is not intended to replace advice given to you by your health care provider. Make sure you discuss any questions you have with your health care  provider. Document Released: 10/03/2007 Document Revised: 12/07/2016 Document Reviewed: 12/07/2016 Elsevier Interactive Patient Education  2019 [employer] Inc.   Cellulitis, Adult  Cellulitis is a skin infection. The infected area is usually warm, red, swollen, and tender. This condition occurs most often in the arms and lower legs. The infection can travel to the muscles, blood, and underlying tissue and become serious. It is very important to get treated for this condition. What are the causes? Cellulitis is caused by bacteria. The bacteria enter through a break in the skin, such as a cut, burn, insect bite, open sore, or crack. What increases the risk? This condition is more likely to occur in people who:  Have a weak body defense system (immune system).  Have open wounds on the skin, such as cuts, burns, bites, and scrapes. Bacteria can enter the body through these open wounds.  Are older than 40 years of age.  Have diabetes.  Have a type of long-lasting (chronic) liver disease (cirrhosis) or kidney disease.  Are obese.  Have a skin condition such as: ? Itchy rash (eczema). ? Slow movement of blood in the veins (venous stasis). ? Fluid buildup below the skin (edema).  Have had radiation therapy.  Use IV drugs. What are the signs or symptoms? Symptoms of this condition include:  Redness, streaking, or spotting on the skin.  Swollen area of the skin.  Tenderness or pain when an area of the skin is touched.  Warm skin.  A fever.  Chills.  Blisters. How is this diagnosed? This condition is diagnosed based on a medical history and physical exam. You may also have tests, including:  Blood tests.  Imaging tests. How is this treated? Treatment for this condition may include:  Medicines, such as antibiotic medicines or medicines to treat allergies (antihistamines).  Supportive care, such as rest and application of cold or warm cloths (compresses) to the  skin.  Hospital care, if the condition is severe. The infection usually starts to get better within 1-2 days of treatment. Follow these instructions at home:  Medicines  Take over-the-counter and prescription medicines only as told by your health care provider.  If you were prescribed an antibiotic medicine, take it as told by your health care provider. Do not stop taking the antibiotic even if you start to feel better. General instructions  Drink enough fluid to keep your urine pale yellow.  Do not touch or rub the infected area.  Raise (elevate) the infected area above the level of your heart while you are sitting or lying down.  Apply warm or cold compresses to the affected area as told by your health care provider.  Keep all follow-up visits as told by your health care provider. This is important. These visits let your health care provider make sure a more serious infection is not developing. Contact a health care provider if:  You have a fever.  Your symptoms do not begin to improve within 1-2 days of starting treatment.  Your bone or joint underneath the infected area becomes painful after the skin has healed.  Your infection returns in the same area or another area.  You notice a swollen bump in the infected area.  You develop new symptoms.  You have a general ill feeling (malaise) with muscle aches and pains. Get help right away if:  Your symptoms get worse.  You feel very sleepy.  You develop vomiting or diarrhea that persists.  You notice red streaks coming from the infected area.  Your red area gets larger or turns dark in color. These symptoms may represent a serious problem that is an emergency. Do not wait to see if the symptoms will go away. Get medical help right away. Call your local emergency services (911 in the [place] Do not drive yourself to the hospital. Summary  Cellulitis is a skin infection. This condition occurs most often in the arms and  lower legs.  Treatment for this condition may include medicines, such as antibiotic medicines or antihistamines.  Take over-the-counter and prescription medicines only as told by your health care provider. If you were prescribed an antibiotic medicine, do not stop taking the antibiotic even if you start to feel better.  Contact a health care provider if your symptoms do not begin to improve within 1-2 days of starting treatment or your symptoms get worse.  Keep all follow-up visits as told by your health care provider. This is important. These visits let your health care provider make sure that a more serious infection is not developing. This information is not intended to replace advice given to you by your health care provider. Make sure you discuss any questions you have with your health care provider. Document Released: 01/24/2005 Document Revised: 09/05/2017 Document Reviewed: 09/05/2017 Elsevier Interactive Patient Education  [employer].    If you have lab work done today you will be contacted with your lab results within the next 2 weeks.  If you have not heard from [place] then please contact us. The fastest way to get your results is to register for My Chart.   IF you received an x-ray today, you will receive an invoice from [facility]. Please contact [facility] at [phone] with questions or concerns regarding your invoice.   IF you received labwork today, you will receive an invoice from [place]. Please contact [facility] at [phone] with questions or concerns regarding your invoice.   Our billing staff will not be able to assist you with questions regarding bills from these companies.  You will be contacted with the lab results as soon as they are available. The fastest way to get your results is to activate your My Chart account. Instructions are located on the last page of this paperwork. If you have not heard from [place] regarding the results in 2  weeks, please contact this office.
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
## 2018-05-16 ENCOUNTER — Ambulatory Visit: Payer: BLUE CROSS/BLUE SHIELD | Admitting: Family Medicine

## 2018-05-16 LAB — WOUND CULTURE: Organism ID, Bacteria: NONE SEEN

## 2018-05-18 ENCOUNTER — Encounter: Payer: Self-pay | Admitting: Family Medicine

## 2018-05-18 ENCOUNTER — Other Ambulatory Visit: Payer: Self-pay | Admitting: Family Medicine

## 2018-05-18 DIAGNOSIS — L0390 Cellulitis, unspecified: Principal | ICD-10-CM

## 2018-05-18 DIAGNOSIS — B9562 Methicillin resistant Staphylococcus aureus infection as the cause of diseases classified elsewhere: Secondary | ICD-10-CM

## 2018-05-18 MED ORDER — DOXYCYCLINE HYCLATE 100 MG PO TABS: 100.0000 mg | ORAL_TABLET | Freq: Two times a day (BID) | ORAL | 0 refills | Status: DC

## 2018-05-18 NOTE — Progress Notes (Signed)
See lab note.  Culture returned as MRSA.  Stop cephalexin, start doxycycline, and advised to follow-up in next 2 days.

## 2018-05-19 MED ORDER — DOXYCYCLINE HYCLATE 100 MG PO CAPS: ORAL_CAPSULE | ORAL | 0 refills | Status: DC

## 2018-05-19 NOTE — Telephone Encounter (Signed)
LMP;05/18/18, order placed at [facility] hospital radiology department, patient scheduled on 05/26/18 @ 2:00pm, Rx sent for doxycycline 100 mg bid x 3 days starting day before procedure., no sexually intercourse 48 hours prior to exam. Patient informed

## 2018-05-26 ENCOUNTER — Ambulatory Visit (HOSPITAL_COMMUNITY)
Admission: RE | Admit: 2018-05-26 | Discharge: 2018-05-26 | Disposition: A | Discharge status: Home / Self Care | Payer: BLUE CROSS/BLUE SHIELD | Source: Ambulatory Visit | Attending: Obstetrics & Gynecology | Admitting: Obstetrics & Gynecology

## 2018-05-26 ENCOUNTER — Other Ambulatory Visit: Payer: Self-pay | Admitting: Obstetrics & Gynecology

## 2018-05-26 DIAGNOSIS — N979 Female infertility, unspecified: Secondary | ICD-10-CM | POA: Insufficient documentation

## 2018-05-26 DIAGNOSIS — R7989 Other specified abnormal findings of blood chemistry: Secondary | ICD-10-CM

## 2018-05-26 MED ORDER — IOPAMIDOL (ISOVUE-300) INJECTION 61%
30.0000 mL | Freq: Once | INTRAVENOUS | Status: AC | PRN
  Administered 2018-05-26: 30 mL

## 2018-05-29 ENCOUNTER — Other Ambulatory Visit: Payer: BLUE CROSS/BLUE SHIELD

## 2018-05-29 DIAGNOSIS — R7989 Other specified abnormal findings of blood chemistry: Secondary | ICD-10-CM

## 2018-05-30 ENCOUNTER — Ambulatory Visit (INDEPENDENT_AMBULATORY_CARE_PROVIDER_SITE_OTHER): Payer: BLUE CROSS/BLUE SHIELD | Admitting: Obstetrics & Gynecology

## 2018-05-30 ENCOUNTER — Encounter: Payer: Self-pay | Admitting: Obstetrics & Gynecology

## 2018-05-30 ENCOUNTER — Ambulatory Visit: Payer: BLUE CROSS/BLUE SHIELD | Admitting: Obstetrics & Gynecology

## 2018-05-30 VITALS — BP 118/70

## 2018-05-30 DIAGNOSIS — N971 Female infertility of tubal origin: Secondary | ICD-10-CM | POA: Diagnosis not present

## 2018-05-30 NOTE — Progress Notes (Signed)
    Christina Cunningham [birth year] [id number]        38 y.o.  G0P0000   RP: Primary Infertility x 6 yrs for Counseling on HSG results  HPI: G0 AMA at 38 yo with HSG showing Rt distal tube occlusion.  H/O Chlamydia infection x 3.  Menses regular normal every month.  Boyfriend has previous paternities.   OB History  Gravida Para Term Preterm AB Living  0 0 0 0 0 0  SAB TAB Ectopic Multiple Live Births  0 0 0 0      Past medical history,surgical history, problem list, medications, allergies, family history and social history were all reviewed and documented in the EPIC chart.   Directed ROS with pertinent positives and negatives documented in the history of present illness/assessment and plan.  Exam:  Vitals:   05/30/18 1323  BP: 118/70   General appearance:  Normal  HSG 05/26/2018: The endometrial cavity is normal in appearance and contour. No signs of mullerian duct anomaly. Opacification of both fallopian tubes is seen. There are multiple small outpouchings from the right tube which opacify with contrast which may reflect filling of glands. The left to appears normal. Intraperitoneal spill of contrast from the left fallopian tube only demonstrated.  IMPRESSION: 1. Normal appearance of the endometrial cavity with a patent left fallopian tube. No free spillage from the right tube identified.   Assessment/Plan:  38 y.o. 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  Probably secondary to Chlamydial infections x 3.  Offered surgery vs referral to Fertility specialist for IVF.  Risks of ectopic pregnancy with scarred tubes reviewed with patient.  Risks associated with AMA at 38 yo discussed.  Will think about it and call back with decision.  Counseling on above issues and coordination of care more than 50% for 25 minutes.  Princess Bruins MD, 1:43 PM 05/30/2018

## 2018-05-31 LAB — T4, FREE: FREE T4: 1 ng/dL (ref 0.8–1.8)

## 2018-05-31 LAB — THYROID STIMULATING IMMUNOGLOBULIN: TSI: <89 % baseline (ref <140)

## 2018-05-31 LAB — T3, FREE: T3 FREE: 3.4 pg/mL (ref 2.3–4.2)

## 2018-06-05 ENCOUNTER — Encounter: Payer: Self-pay | Admitting: Obstetrics & Gynecology

## 2018-06-05 NOTE — Patient Instructions (Signed)
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  Probably secondary to Chlamydial infections x 3.  Offered surgery vs referral to Fertility specialist for IVF.  Risks of ectopic pregnancy with scarred tubes reviewed with patient.  Risks associated with AMA at 38 yo discussed.  Will think about it and call back with decision.  Paisely, it was a pleasure seeing you today!

## 2018-06-11 DIAGNOSIS — L280 Lichen simplex chronicus: Secondary | ICD-10-CM | POA: Insufficient documentation

## 2019-07-16 ENCOUNTER — Encounter: Payer: Self-pay | Admitting: Adult Health Nurse Practitioner

## 2019-07-16 ENCOUNTER — Ambulatory Visit (INDEPENDENT_AMBULATORY_CARE_PROVIDER_SITE_OTHER): Payer: BC Managed Care – PPO | Admitting: Adult Health Nurse Practitioner

## 2019-07-16 ENCOUNTER — Other Ambulatory Visit: Payer: Self-pay

## 2019-07-16 VITALS — BP 125/80 | HR 66 | Temp 98.0°F | Ht 65.0 in | Wt 138.4 lb

## 2019-07-16 DIAGNOSIS — B379 Candidiasis, unspecified: Secondary | ICD-10-CM

## 2019-07-16 DIAGNOSIS — B373 Candidiasis of vulva and vagina: Secondary | ICD-10-CM

## 2019-07-16 DIAGNOSIS — Z23 Encounter for immunization: Secondary | ICD-10-CM

## 2019-07-16 DIAGNOSIS — B3731 Acute candidiasis of vulva and vagina: Secondary | ICD-10-CM | POA: Insufficient documentation

## 2019-07-16 HISTORY — DX: Acute candidiasis of vulva and vagina: B37.31

## 2019-07-16 HISTORY — DX: Candidiasis of vulva and vagina: B37.3

## 2019-07-16 LAB — POCT URINALYSIS DIP (MANUAL ENTRY)
Bilirubin, UA: NEGATIVE
Blood, UA: NEGATIVE
Glucose, UA: NEGATIVE mg/dL
Ketones, POC UA: NEGATIVE mg/dL
Leukocytes, UA: NEGATIVE
Nitrite, UA: NEGATIVE
Protein Ur, POC: NEGATIVE mg/dL
Spec Grav, UA: 1.02 (ref 1.010–1.025)
Urobilinogen, UA: 0.2 E.U./dL
pH, UA: 7.5 (ref 5.0–8.0)

## 2019-07-16 LAB — POCT WET + KOH PREP: Trich by wet prep: ABSENT

## 2019-07-16 MED ORDER — FLUCONAZOLE 150 MG PO TABS: 150.0000 mg | ORAL_TABLET | Freq: Every day | ORAL | 3 refills | Status: DC

## 2019-07-16 NOTE — Progress Notes (Signed)
   07/16/2019  Christina Cunningham January 13, 1981 [id number]  Subjective:     Christina Cunningham is a 39 y.o. female who presents for evaluation of vaginal symptoms of burning and itching and vaginal discharge white and curd-like. Symptoms have been present for 3 days. Symptoms include discharge described as curd-like and malodorous and vulvar itching. Menstrual pattern: bleeding regularly. Contraception: none STI Risk: 3 x chlamydia in the past with occluded right ovary and scarring.   The following portions of the patient's history were reviewed and updated as appropriate: allergies, current medications, past family history, past medical history, past social history, past surgical history and problem list.  Review of Systems Genitourinary:positive for vaginal discharge, negative for genital lesions    Objective:    No exam performed today, Vaginal self swab.    Assessment:    Candida vaginitis.    Plan:    Symptomatic local care discussed. Oral antifungal see orders.

## 2019-07-16 NOTE — Patient Instructions (Signed)
° ° ° °  If you have lab work done today you will be contacted with your lab results within the next 2 weeks.  If you have not heard from us then please contact us. The fastest way to get your results is to register for My Chart. ° ° °IF you received an x-ray today, you will receive an invoice from [facility]. Please contact [facility] at [phone] with questions or concerns regarding your invoice.  ° °IF you received labwork today, you will receive an invoice from [facility]. Please contact [facility] at [phone] with questions or concerns regarding your invoice.  ° °Our billing staff will not be able to assist you with questions regarding bills from these companies. ° °You will be contacted with the lab results as soon as they are available. The fastest way to get your results is to activate your My Chart account. Instructions are located on the last page of this paperwork. If you have not heard from us regarding the results in 2 weeks, please contact this office. °  ° ° ° °

## 2019-11-12 ENCOUNTER — Other Ambulatory Visit: Payer: Self-pay

## 2019-11-12 ENCOUNTER — Ambulatory Visit (INDEPENDENT_AMBULATORY_CARE_PROVIDER_SITE_OTHER): Payer: BC Managed Care – PPO | Admitting: Emergency Medicine

## 2019-11-12 ENCOUNTER — Encounter: Payer: Self-pay | Admitting: Emergency Medicine

## 2019-11-12 ENCOUNTER — Ambulatory Visit: Payer: BC Managed Care – PPO | Admitting: Family Medicine

## 2019-11-12 VITALS — BP 109/69 | HR 72 | Temp 97.6°F | Resp 16 | Ht 65.0 in | Wt 137.0 lb

## 2019-11-12 DIAGNOSIS — N926 Irregular menstruation, unspecified: Secondary | ICD-10-CM

## 2019-11-12 DIAGNOSIS — J4520 Mild intermittent asthma, uncomplicated: Secondary | ICD-10-CM | POA: Diagnosis not present

## 2019-11-12 LAB — POCT URINE PREGNANCY: Preg Test, Ur: NEGATIVE

## 2019-11-12 MED ORDER — ALBUTEROL SULFATE HFA 108 (90 BASE) MCG/ACT IN AERS: 2.0000 | INHALATION_SPRAY | Freq: Four times a day (QID) | RESPIRATORY_TRACT | 1 refills | Status: DC | PRN
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
## 2019-11-12 NOTE — Patient Instructions (Addendum)
If you have lab work done today you will be contacted with your lab results within the next 2 weeks.  If you have not heard from [place] then please contact us. The fastest way to get your results is to register for My Chart.   IF you received an x-ray today, you will receive an invoice from [facility]. Please contact [facility] at [phone] with questions or concerns regarding your invoice.   IF you received labwork today, you will receive an invoice from [facility]. Please contact [facility] at [phone] with questions or concerns regarding your invoice.   Our billing staff will not be able to assist you with questions regarding bills from these companies.  You will be contacted with the lab results as soon as they are available. The fastest way to get your results is to activate your My Chart account. Instructions are located on the last page of this paperwork. If you have not heard from [place] regarding the results in 2 weeks, please contact this office.     Primary Amenorrhea  Primary amenorrhea is when a female has not started having periods by the age of 39 years. What are the causes? This condition may be caused by:  An abnormal chromosome that causes the ovaries not to work properly (common).  Malnutrition.  Low blood sugar (hypoglycemia).  Polycystic ovary syndrome.  Being born without a vagina, uterus, or ovaries.  Extreme obesity.  Cystic fibrosis.  Drastic weight loss.  Over-exercising that leads to a loss of body fat.  Pituitary gland tumor in the brain.  Long-term illnesses.  Cushing disease.  A thyroid disease, such as hypothyroidism or hyperthyroidism.  A part of the brain called the hypothalamus not working normally.  Premature ovarian failure. What are the signs or symptoms? The main symptom of this condition is not having had a period by age 39 years. Other symptoms include:  Discharge from the breasts.  Hot  flashes.  Adult acne.  Facial or chest hair.  Headaches.  Impaired vision.  Recent excessive stress.  Changes in weight, diet, or exercise patterns. How is this diagnosed? This condition may be diagnosed based on:  Your medical history.  A physical exam.  Tests, such as: ? Blood tests. ? Urine tests. How is this treated? Treatment for this condition depends on the cause. For example, an absence of sex organs will require surgery to correct the problem. Others causes may respond when treated with medicine. Follow these instructions at home:  Maintain a healthy diet.  Maintain a healthy weight.  Exercise regularly but not excessively.  Take over-the-counter and prescription medicines only as told by your health care provider.  Keep all follow-up visits as told by your health care provider. This is important. Contact a health care provider if:  Your body is not developing at the level of your peers.  You have pelvic pain.  You gain an unusual amount of weight.  You have an unusual amount of hair growth. Summary  Primary amenorrhea is when a female has not started having periods by the age of 39 years.  This condition has many causes, including abnormal ovaries, obesity, extreme weight loss.  Contact a health care provider if your body is not developing at the level of your peers. This information is not intended to replace advice given to you by your health care provider. Make sure you discuss any questions you have with your health care provider. Document Revised: 09/30/2018 Document Reviewed: 07/03/2016  Elsevier Patient Education  [employer].

## 2019-11-13 LAB — CBC WITH DIFFERENTIAL/PLATELET
Basophils Absolute: 0 10*3/uL (ref 0.0–0.2)
Basos: 0 %
EOS (ABSOLUTE): 0.1 10*3/uL (ref 0.0–0.4)
Eos: 1 %
Hematocrit: 44.5 % (ref 34.0–46.6)
Hemoglobin: 13.8 g/dL (ref 11.1–15.9)
Immature Grans (Abs): 0 10*3/uL (ref 0.0–0.1)
Immature Granulocytes: 0 %
Lymphocytes Absolute: 1.5 10*3/uL (ref 0.7–3.1)
Lymphs: 25 %
MCH: 25.5 pg — ABNORMAL LOW (ref 26.6–33.0)
MCHC: 31 g/dL — ABNORMAL LOW (ref 31.5–35.7)
MCV: 82 fL (ref 79–97)
Monocytes Absolute: 0.8 10*3/uL (ref 0.1–0.9)
Monocytes: 13 %
Neutrophils Absolute: 3.6 10*3/uL (ref 1.4–7.0)
Neutrophils: 61 %
Platelets: 234 10*3/uL (ref 150–450)
RBC: 5.42 x10E6/uL — ABNORMAL HIGH (ref 3.77–5.28)
RDW: 15.3 % (ref 11.7–15.4)
WBC: 5.9 10*3/uL (ref 3.4–10.8)

## 2019-11-13 LAB — FSH/LH
FSH: 65.6 m[IU]/mL
LH: 42.7 m[IU]/mL

## 2019-11-13 LAB — HCG, SERUM, QUALITATIVE: hCG,Beta Subunit,Qual,Serum: NEGATIVE m[IU]/mL (ref <6)

## 2019-11-13 LAB — COMPREHENSIVE METABOLIC PANEL
ALT: 11 IU/L (ref 0–32)
AST: 21 IU/L (ref 0–40)
Albumin/Globulin Ratio: 1.5 (ref 1.2–2.2)
Albumin: 4.6 g/dL (ref 3.8–4.8)
Alkaline Phosphatase: 119 IU/L (ref 48–121)
BUN/Creatinine Ratio: 10 (ref 9–23)
BUN: 7 mg/dL (ref 6–24)
Bilirubin Total: 0.4 mg/dL (ref 0.0–1.2)
CO2: 24 mmol/L (ref 20–29)
Calcium: 9.8 mg/dL (ref 8.7–10.2)
Chloride: 102 mmol/L (ref 96–106)
Creatinine, Ser: 0.73 mg/dL (ref 0.57–1.00)
GFR calc Af Amer: 119 mL/min/{1.73_m2} (ref >59)
GFR calc non Af Amer: 103 mL/min/{1.73_m2} (ref >59)
Globulin, Total: 3.1 g/dL (ref 1.5–4.5)
Glucose: 95 mg/dL (ref 65–99)
Potassium: 4.4 mmol/L (ref 3.5–5.2)
Sodium: 141 mmol/L (ref 134–144)
Total Protein: 7.7 g/dL (ref 6.0–8.5)

## 2019-11-13 LAB — THYROID PANEL WITH TSH
Free Thyroxine Index: 1.8 (ref 1.2–4.9)
T3 Uptake Ratio: 31 % (ref 24–39)
T4, Total: 5.8 ug/dL (ref 4.5–12.0)
TSH: 0.568 u[IU]/mL (ref 0.450–4.500)

## 2019-11-13 LAB — HEMOGLOBIN A1C
Est. average glucose Bld gHb Est-mCnc: 117 mg/dL
Hgb A1c MFr Bld: 5.7 % — ABNORMAL HIGH (ref 4.8–5.6)

## 2019-11-16 ENCOUNTER — Encounter: Payer: Self-pay | Admitting: Radiology

## 2019-11-16 ENCOUNTER — Telehealth: Payer: Self-pay | Admitting: Family Medicine

## 2019-11-16 NOTE — Telephone Encounter (Signed)
Pt called and is wanting a call to read off her lab results from 11/12/19. Let pt know that they are available via mychart and that she has a letter being sent out to her. Please advise.

## 2019-11-16 NOTE — Telephone Encounter (Signed)
Unable to reach patient by phone but if patient call again please give patient below msg  Call patient please.  Normal blood results including normal thyroid function test.  Send letter with copies of results.  Needs to follow-up with gynecologist for further evaluation.  Thanks.  Letter has been mailed as well

## 2020-01-11 ENCOUNTER — Other Ambulatory Visit: Payer: Self-pay

## 2020-01-11 ENCOUNTER — Telehealth: Payer: Self-pay

## 2020-01-11 ENCOUNTER — Encounter: Payer: Self-pay | Admitting: Obstetrics and Gynecology

## 2020-01-11 ENCOUNTER — Ambulatory Visit (INDEPENDENT_AMBULATORY_CARE_PROVIDER_SITE_OTHER): Payer: BC Managed Care – PPO | Admitting: Obstetrics and Gynecology

## 2020-01-11 VITALS — BP 113/73 | HR 67 | Ht 65.0 in | Wt 137.5 lb

## 2020-01-11 DIAGNOSIS — N926 Irregular menstruation, unspecified: Secondary | ICD-10-CM | POA: Diagnosis not present

## 2020-01-11 DIAGNOSIS — Z1239 Encounter for other screening for malignant neoplasm of breast: Secondary | ICD-10-CM

## 2020-01-11 MED ORDER — PROGESTERONE MICRONIZED 200 MG PO CAPS: ORAL_CAPSULE | ORAL | 5 refills | Status: DC

## 2020-01-11 NOTE — Progress Notes (Signed)
Pt states last period was 09/23/19, started spotting on 01/03/20 only when she wiped.

## 2020-01-11 NOTE — Telephone Encounter (Signed)
Called pt to advise of rescheduled [place] on 01/19/20 @ 2pm, no answer, was not able to leave a message.Appointment showing on My Chart.

## 2020-01-11 NOTE — Progress Notes (Addendum)
[place] appt on 01/19/20 @2pm  , arrive 1:45p w/ full bladder. Pt verbalized understanding.

## 2020-01-11 NOTE — Progress Notes (Signed)
Patient ID: Christina Cunningham, female   DOB: 1981/03/02, 39 y.o.   MRN: [id number] Christina Cunningham presents with a long history of irregular cycles. Referred by FM. W/U with them was normal. She reports monthly cycles but nit the same time each month for most of her life. Has tried OCP's in the past but did not help regulate her cycles. Reports heavy cycles with cramps, last 5 days. U/S 2/19, small fibroids.  LMP 09/23/19. Had 4 days of vaginal bleeding from 9/5-01/07/20 Last pap 2019  Nulligravid Sexually active, condoms Had considered pregnancy in the past but not at this time.  PE AF VSS Lungs clear Heart RRR Abd soft + BS GU Nl EGBUS, cervix no lesions, uterus small < 8 weeks mobile non tender no masses  A/P Irregular cycles        Screening mammogram  Discussed causes of irregular cycles. Suspect hormonally related. Will check GYN U/S. Cycle with progesterone next month. Pt considering definite treatment. F/U in 4 weeks

## 2020-01-15 ENCOUNTER — Ambulatory Visit (HOSPITAL_COMMUNITY): Payer: BC Managed Care – PPO

## 2020-01-19 ENCOUNTER — Ambulatory Visit: Admission: RE | Admit: 2020-01-19 | Payer: BC Managed Care – PPO | Source: Ambulatory Visit

## 2020-02-18 ENCOUNTER — Ambulatory Visit: Payer: BC Managed Care – PPO | Admitting: Obstetrics and Gynecology

## 2020-09-08 ENCOUNTER — Emergency Department (HOSPITAL_BASED_OUTPATIENT_CLINIC_OR_DEPARTMENT_OTHER): Payer: 59 | Admitting: Radiology

## 2020-09-08 ENCOUNTER — Emergency Department (HOSPITAL_BASED_OUTPATIENT_CLINIC_OR_DEPARTMENT_OTHER)
Admission: EM | Admit: 2020-09-08 | Discharge: 2020-09-09 | Disposition: A | Discharge status: Home / Self Care | Payer: 59 | Attending: Emergency Medicine | Admitting: Emergency Medicine

## 2020-09-08 ENCOUNTER — Other Ambulatory Visit: Payer: Self-pay

## 2020-09-08 ENCOUNTER — Encounter (HOSPITAL_BASED_OUTPATIENT_CLINIC_OR_DEPARTMENT_OTHER): Payer: Self-pay | Admitting: *Deleted

## 2020-09-08 DIAGNOSIS — F17210 Nicotine dependence, cigarettes, uncomplicated: Secondary | ICD-10-CM | POA: Diagnosis not present

## 2020-09-08 DIAGNOSIS — S39012A Strain of muscle, fascia and tendon of lower back, initial encounter: Secondary | ICD-10-CM | POA: Insufficient documentation

## 2020-09-08 DIAGNOSIS — S29012A Strain of muscle and tendon of back wall of thorax, initial encounter: Secondary | ICD-10-CM | POA: Insufficient documentation

## 2020-09-08 DIAGNOSIS — Y92410 Unspecified street and highway as the place of occurrence of the external cause: Secondary | ICD-10-CM | POA: Insufficient documentation

## 2020-09-08 DIAGNOSIS — S3992XA Unspecified injury of lower back, initial encounter: Secondary | ICD-10-CM | POA: Diagnosis present

## 2020-09-08 LAB — PREGNANCY, URINE: Preg Test, Ur: NEGATIVE
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
## 2020-09-09 DIAGNOSIS — M545 Low back pain, unspecified: Secondary | ICD-10-CM | POA: Diagnosis not present

## 2020-09-09 DIAGNOSIS — M546 Pain in thoracic spine: Secondary | ICD-10-CM | POA: Diagnosis not present

## 2020-09-09 MED ORDER — NAPROXEN 375 MG PO TABS: ORAL_TABLET | ORAL | 0 refills | Status: DC

## 2020-09-09 MED ORDER — NAPROXEN 250 MG PO TABS: 500.0000 mg | ORAL_TABLET | Freq: Once | ORAL | Status: DC

## 2020-09-09 MED ORDER — CYCLOBENZAPRINE HCL 10 MG PO TABS: 10.0000 mg | ORAL_TABLET | Freq: Three times a day (TID) | ORAL | 0 refills | Status: DC | PRN

## 2020-09-09 MED ORDER — CYCLOBENZAPRINE HCL 10 MG PO TABS: 10.0000 mg | ORAL_TABLET | Freq: Once | ORAL | Status: DC

## 2020-10-24 ENCOUNTER — Ambulatory Visit (INDEPENDENT_AMBULATORY_CARE_PROVIDER_SITE_OTHER): Payer: 59 | Admitting: Nurse Practitioner

## 2020-10-24 ENCOUNTER — Other Ambulatory Visit: Payer: Self-pay

## 2020-10-24 ENCOUNTER — Other Ambulatory Visit (HOSPITAL_BASED_OUTPATIENT_CLINIC_OR_DEPARTMENT_OTHER): Payer: Self-pay

## 2020-10-24 ENCOUNTER — Encounter (HOSPITAL_BASED_OUTPATIENT_CLINIC_OR_DEPARTMENT_OTHER): Payer: Self-pay | Admitting: Nurse Practitioner

## 2020-10-24 VITALS — BP 126/56 | HR 63 | Ht 64.0 in | Wt 145.4 lb

## 2020-10-24 DIAGNOSIS — Z113 Encounter for screening for infections with a predominantly sexual mode of transmission: Secondary | ICD-10-CM | POA: Insufficient documentation

## 2020-10-24 DIAGNOSIS — Z0000 Encounter for general adult medical examination without abnormal findings: Secondary | ICD-10-CM | POA: Diagnosis not present

## 2020-10-24 DIAGNOSIS — N926 Irregular menstruation, unspecified: Secondary | ICD-10-CM

## 2020-10-24 DIAGNOSIS — Z1321 Encounter for screening for nutritional disorder: Secondary | ICD-10-CM

## 2020-10-24 DIAGNOSIS — N951 Menopausal and female climacteric states: Secondary | ICD-10-CM | POA: Diagnosis not present

## 2020-10-24 DIAGNOSIS — J4520 Mild intermittent asthma, uncomplicated: Secondary | ICD-10-CM | POA: Insufficient documentation

## 2020-10-24 DIAGNOSIS — Z13228 Encounter for screening for other metabolic disorders: Secondary | ICD-10-CM | POA: Diagnosis not present

## 2020-10-24 DIAGNOSIS — Z130 Encounter for screening for diseases of the blood and blood-forming organs and certain disorders involving the immune mechanism: Secondary | ICD-10-CM

## 2020-10-24 DIAGNOSIS — Z1329 Encounter for screening for other suspected endocrine disorder: Secondary | ICD-10-CM

## 2020-10-24 DIAGNOSIS — L308 Other specified dermatitis: Secondary | ICD-10-CM | POA: Diagnosis not present

## 2020-10-24 DIAGNOSIS — Z131 Encounter for screening for diabetes mellitus: Secondary | ICD-10-CM | POA: Diagnosis not present

## 2020-10-24 DIAGNOSIS — Z13220 Encounter for screening for lipoid disorders: Secondary | ICD-10-CM | POA: Diagnosis not present

## 2020-10-24 DIAGNOSIS — Z1231 Encounter for screening mammogram for malignant neoplasm of breast: Secondary | ICD-10-CM

## 2020-10-24 DIAGNOSIS — F4321 Adjustment disorder with depressed mood: Secondary | ICD-10-CM

## 2020-10-24 HISTORY — DX: Encounter for general adult medical examination without abnormal findings: Z00.00

## 2020-10-24 HISTORY — DX: Irregular menstruation, unspecified: N92.6

## 2020-10-24 MED ORDER — ALBUTEROL SULFATE HFA 108 (90 BASE) MCG/ACT IN AERS
2.0000 | INHALATION_SPRAY | Freq: Four times a day (QID) | RESPIRATORY_TRACT | 6 refills | Status: DC | PRN
  Filled 2020-10-24: qty 18, 15d supply, fill #0

## 2020-10-24 MED ORDER — CALCIPOTRIENE 0.005 % EX CREA
TOPICAL_CREAM | Freq: Two times a day (BID) | CUTANEOUS | 6 refills | Status: DC
  Filled 2020-10-24: qty 60, 14d supply, fill #0

## 2020-10-24 MED ORDER — CLOBETASOL PROPIONATE 0.05 % EX LIQD
Freq: Two times a day (BID) | CUTANEOUS | 6 refills | Status: DC
  Filled 2020-10-24: qty 59, 14d supply, fill #0

## 2020-10-24 MED ORDER — CITALOPRAM HYDROBROMIDE 10 MG PO TABS
ORAL_TABLET | ORAL | 3 refills | Status: DC
  Filled 2020-10-24: qty 60, 30d supply, fill #0

## 2020-10-24 NOTE — Patient Instructions (Signed)
Recommendations from today's visit: We will let you know about your labs- if there is anything we need to discuss we will call you, otherwise we will send a MyChart message.   Information on diet, exercise, and health maintenance recommendations are listed below. This is information to help you be sure you are on track for optimal health and monitoring.   Please look over this and let us know if you have any questions or if you have completed any of the health maintenance outside of [facility] so that we can be sure your records are up to date.  ___________________________________________________________  Thank you for choosing [facility] for your Primary Care needs. I am excited for the opportunity to partner with you to meet your health care goals. It was a pleasure meeting you today!  I am an Adult-Geriatric Nurse Practitioner with a background in caring for patients for more than 20 years. I received my Paediatric nurse in Nursing and my Doctor of Nursing Practice degrees at [employer]. I received additional fellowship training in primary care and sports medicine after receiving my doctorate degree. I provide primary care and sports medicine services to patients age 80 and older within this office. I am also a provider with the [facility] and the director of the APP Fellowship with [facility].  I am a [place] native, but have called the [place] area home for nearly 20 years and am proud to be a member of this community.   I am passionate about providing the best service to you through preventive medicine and supportive care. I consider you a part of the medical team and value your input. I work diligently to ensure that you are heard and your needs are met in a safe and effective manner. I want you to feel comfortable with me as your provider and want you to know that your health concerns are important to  me.   For your information, our office hours are Monday- Friday 8:00 AM - 5:00 PM At this time I am not in the office on Wednesdays.  If you have questions or concerns, please call our office at [phone] or send [place] a MyChart message and we will respond as quickly as possible.   For all urgent or time sensitive needs we ask that you please call the office to avoid delays. MyChart is not constantly monitored and replies may take up to 72 business hours.  MyChart Policy: MyChart allows for you to see your visit notes, after visit summary, provider recommendations, lab and tests results, make an appointment, request refills, and contact your provider or the office for non-urgent questions or concerns.  Providers are seeing patients during normal business hours and do not have built in time to review MyChart messages. We ask that you allow a minimum of 72 business hours for MyChart message responses.  Complex MyChart concerns may require a visit. Your provider may request you schedule a virtual or in person visit to ensure we are providing the best care possible. MyChart messages sent after 4:00 PM on Friday will not be received by the provider until Monday morning.    Lab and Test Results: You will receive your lab and test results on MyChart as soon as they are completed and results have been sent by the lab or testing facility. Due to this service, you will receive your results BEFORE your provider.  Please allow a minimum of  72 business hours for your provider to receive and review lab and test results and contact you about.   Most lab and test result comments from the provider will be sent through [facility]. Your provider may recommend changes to the plan of care, follow-up visits, repeat testing, ask questions, or request an office visit to discuss these results. You may reply directly to this message or call the office at [phone] to provide information for the provider or set up an  appointment. In some instances, you will be called with test results and recommendations. Please let us know if this is preferred and we will make note of this in your chart to provide this for you.    If you have not heard a response to your lab or test results in 72 business hours, please call the office to let us know.   After Hours: For all non-emergency after hours needs, please call the office at [phone] and select the option to reach the on-call provider service. On-call services are shared between multiple [facility] offices and therefore it will not be possible to speak directly with your provider. On-call providers may provide medical advice and recommendations, but are unable to provide refills for maintenance medications.  For all emergency or urgent medical needs after normal business hours, we recommend that you seek care at the closest Urgent Care or Emergency Department to ensure appropriate treatment in a timely manner.  [facility] [place] has a 24 hour emergency room located on the ground floor for your convenience.    Please do not hesitate to reach out to [place] with concerns.   Thank you, again, for choosing me as your health care partner. I appreciate your trust and look forward to learning more about you.   Worthy Keeler, DNP, AGNP-c ___________________________________________________________  Health Maintenance Recommendations Screening Testing Mammogram Every 1 -2 years based on history and risk factors Starting at age 71 Pap Smear Ages 21-39 every 3 years Ages 41-65 every 5 years with HPV testing More frequent testing may be required based on results and history Colon Cancer Screening Every 1-10 years based on test performed, risk factors, and history Starting at age 86 Bone Density Screening Every 2-10 years based on history Starting at age 82 for women Recommendations for men differ based on medication usage, history, and risk  factors AAA Screening One time ultrasound Men 70-57 years old who have every smoked Lung Cancer Screening Low Dose Lung CT every 12 months Age 67-80 years with a 30 pack-year smoking history who still smoke or who have quit within the last 15 years  Screening Labs Routine  Labs: Complete Blood Count (CBC), Complete Metabolic Panel (CMP), Cholesterol (Lipid Panel) Every 6-12 months based on history and medications May be recommended more frequently based on current conditions or previous results Hemoglobin A1c Lab Every 3-12 months based on history and previous results Starting at age 50 or earlier with diagnosis of diabetes, high cholesterol, BMI >26, and/or risk factors Frequent monitoring for patients with diabetes to ensure blood sugar control Thyroid Panel (TSH w/ T3 & T4) Every 6 months based on history, symptoms, and risk factors May be repeated more often if on medication HIV One time testing for all patients 21 and older May be repeated more frequently for patients with increased risk factors or exposure Hepatitis C One time testing for all patients 12 and older May be repeated more frequently for patients with increased risk factors or exposure Gonorrhea, Chlamydia Every  12 months for all sexually active persons 13-24 years Additional monitoring may be recommended for those who are considered high risk or who have symptoms PSA Men 14-55 years old with risk factors Additional screening may be recommended from age 54-69 based on risk factors, symptoms, and history  Vaccine Recommendations Tetanus Booster All adults every 10 years Flu Vaccine All patients 6 months and older every year COVID Vaccine All patients 12 years and older Initial dosing with booster May recommend additional booster based on age and health history HPV Vaccine 2 doses all patients age 65-26 Dosing may be considered for patients over 26 Shingles Vaccine (Shingrix) 2 doses all adults 67 years and  older Pneumonia (Pneumovax 23) All adults 98 years and older May recommend earlier dosing based on health history Pneumonia (Prevnar 10) All adults 45 years and older Dosed 1 year after Pneumovax 23  Additional Screening, Testing, and Vaccinations may be recommended on an individualized basis based on family history, health history, risk factors, and/or exposure.  __________________________________________________________  Diet Recommendations for All Patients  I recommend that all patients maintain a diet low in saturated fats, carbohydrates, and cholesterol. While this can be challenging at first, it is not impossible and small changes can make big differences.  Things to try: Decreasing the amount of soda, sweet tea, and/or juice to one or less per day and replace with water While water is always the first choice, if you do not like water you may consider adding a water additive without sugar to improve the taste other sugar free drinks Replace potatoes with a brightly colored vegetable at dinner Use healthy oils, such as canola oil or olive oil, instead of butter or hard margarine Limit your bread intake to two pieces or less a day Replace regular pasta with low carb pasta options Bake, broil, or grill foods instead of frying Monitor portion sizes  Eat smaller, more frequent meals throughout the day instead of large meals  An important thing to remember is, if you love foods that are not great for your health, you don't have to give them up completely. Instead, allow these foods to be a reward when you have done well. Allowing yourself to still have special treats every once in a while is a nice way to tell yourself thank you for working hard to keep yourself healthy.   Also remember that every day is a new day. If you have a bad day and "fall off the wagon", you can still climb right back up and keep moving along on your journey!  We have resources available to help you!  Some  websites that may be helpful include: www.http://carter.biz/  Www.VeryWellFit.com _____________________________________________________________  Activity Recommendations for All Patients  I recommend that all adults get at least 20 minutes of moderate physical activity that elevates your heart rate at least 5 days out of the week.  Some examples include: Walking or jogging at a pace that allows you to carry on a conversation Cycling (stationary bike or outdoors) Water aerobics Yoga Weight lifting Dancing If physical limitations prevent you from putting stress on your joints, exercise in a pool or seated in a chair are excellent options.  Do determine your MAXIMUM heart rate for activity: YOUR AGE - 220 = MAX HeartRate   Remember! Do not push yourself too hard.  Start slowly and build up your pace, speed, weight, time in exercise, etc.  Allow your body to rest between exercise and get good sleep. You will need  more water than normal when you are exerting yourself. Do not wait until you are thirsty to drink. Drink with a purpose of getting in at least 8, 8 ounce glasses of water a day plus more depending on how much you exercise and sweat.    If you begin to develop dizziness, chest pain, abdominal pain, jaw pain, shortness of breath, headache, vision changes, lightheadedness, or other concerning symptoms, stop the activity and allow your body to rest. If your symptoms are severe, seek emergency evaluation immediately. If your symptoms are concerning, but not severe, please let us know so that we can recommend further evaluation.   ________________________________________________________________

## 2020-10-24 NOTE — Assessment & Plan Note (Signed)
Review of current and past medical history, social history, medication, and family history.  Review of care gaps and health maintenance recommendations.  Records from recent providers to be requested if not available in Chart Review or Care Everywhere.  Recommendations for health maintenance, diet, and exercise provided.  CPE performed and labs collected for further evaluation. F/U in 1 year for CPE

## 2020-10-24 NOTE — Assessment & Plan Note (Signed)
Historical irregular menses over the past year with recent 2 missed menstrual cycles.  UPT negative today.  No concerning findings present today. Evaluation of labs from 11/2019 reveal normal TSH, but hormone levels indicating perimenopause.  Discussed with patient options of OCP for regulation of menses and hot flashes,but she declines at this time.  Will start citalopram for hot flashes and monitor menstruation for cessation.

## 2020-10-24 NOTE — Assessment & Plan Note (Signed)
Refills provided today- using inhaler PRN for exacerbations not on daily use.  Well controlled.

## 2020-10-24 NOTE — Assessment & Plan Note (Signed)
Bilateral elbow involvement with the R worse than the L.  Erythematous plaques with white scaling present. Hypertrophy of the tissue is present bilaterally. Likely related to scarring from repeated excoriation and mechanical irritation.  Right anterior calf reveals ulceration with no signs of infection.  Recommend wash area twice a day with gentle, non-scented cleanser and pat dry. Apply thin layer of topical steroid with calcipotriene cream and cover with breathable gauze pad to protect.  If symptoms not improving 2 weeks, recommend f/u with dermatology for further evaluation.

## 2020-10-24 NOTE — Assessment & Plan Note (Signed)
Presentation consistent with situational depression related to several recent losses of family and likely compounded by hormonal changes with peri-menopause.  Plan to start citalopram titrated to 20mg  for symptom management of both vasomotor symptoms and depression.  F/U in 8 weeks for evaluation of effectiveness.

## 2020-10-24 NOTE — Assessment & Plan Note (Signed)
Daily flushing and hot flashes in the setting of peri-menopause with missed menses x 2 months.  UPT negative.  Discussed OCP for management, however, she declines this option today.  Recommend citalopram for vasomotor symptoms and depression- she is agreeable to start today.
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
## 2020-10-25 ENCOUNTER — Ambulatory Visit (HOSPITAL_BASED_OUTPATIENT_CLINIC_OR_DEPARTMENT_OTHER)
Admission: RE | Admit: 2020-10-25 | Discharge: 2020-10-25 | Disposition: A | Discharge status: Home / Self Care | Payer: 59 | Source: Ambulatory Visit | Attending: Nurse Practitioner | Admitting: Nurse Practitioner

## 2020-10-25 ENCOUNTER — Other Ambulatory Visit (HOSPITAL_BASED_OUTPATIENT_CLINIC_OR_DEPARTMENT_OTHER): Payer: Self-pay

## 2020-10-25 DIAGNOSIS — Z1231 Encounter for screening mammogram for malignant neoplasm of breast: Secondary | ICD-10-CM | POA: Diagnosis not present

## 2020-10-25 LAB — POCT GLYCOSYLATED HEMOGLOBIN (HGB A1C)
HbA1c POC (<> result, manual entry): 5.9 % (ref 4.0–5.6)
HbA1c, POC (prediabetic range): 5.9 % (ref 5.7–6.4)
Hemoglobin A1C: 5.9 % — AB (ref 4.0–5.6)

## 2020-10-25 NOTE — Addendum Note (Signed)
Addended by: Krystyn Picking, Clarise Cruz E on: 10/25/2020 08:32 AM   Modules accepted: Orders

## 2020-10-26 LAB — LIPID PANEL
Chol/HDL Ratio: 2.7 ratio (ref 0.0–4.4)
Cholesterol, Total: 142 mg/dL (ref 100–199)
HDL: 52 mg/dL (ref >39)
LDL Chol Calc (NIH): 77 mg/dL (ref 0–99)
Triglycerides: 65 mg/dL (ref 0–149)
VLDL Cholesterol Cal: 13 mg/dL (ref 5–40)

## 2020-10-26 LAB — CBC WITH DIFFERENTIAL/PLATELET
Basophils Absolute: 0 10*3/uL (ref 0.0–0.2)
Basos: 0 %
EOS (ABSOLUTE): 0.1 10*3/uL (ref 0.0–0.4)
Eos: 2 %
Hematocrit: 44.8 % (ref 34.0–46.6)
Hemoglobin: 14.5 g/dL (ref 11.1–15.9)
Immature Grans (Abs): 0 10*3/uL (ref 0.0–0.1)
Immature Granulocytes: 0 %
Lymphocytes Absolute: 1.4 10*3/uL (ref 0.7–3.1)
Lymphs: 33 %
MCH: 27.6 pg (ref 26.6–33.0)
MCHC: 32.4 g/dL (ref 31.5–35.7)
MCV: 85 fL (ref 79–97)
Monocytes Absolute: 0.6 10*3/uL (ref 0.1–0.9)
Monocytes: 13 %
Neutrophils Absolute: 2.2 10*3/uL (ref 1.4–7.0)
Neutrophils: 52 %
Platelets: 201 10*3/uL (ref 150–450)
RBC: 5.25 x10E6/uL (ref 3.77–5.28)
RDW: 13.8 % (ref 11.7–15.4)
WBC: 4.2 10*3/uL (ref 3.4–10.8)

## 2020-10-26 LAB — COMPREHENSIVE METABOLIC PANEL
ALT: 26 IU/L (ref 0–32)
AST: 29 IU/L (ref 0–40)
Albumin/Globulin Ratio: 1.7 (ref 1.2–2.2)
Albumin: 4.7 g/dL (ref 3.8–4.8)
Alkaline Phosphatase: 177 IU/L — ABNORMAL HIGH (ref 44–121)
BUN/Creatinine Ratio: 12 (ref 9–23)
BUN: 9 mg/dL (ref 6–24)
Bilirubin Total: 0.6 mg/dL (ref 0.0–1.2)
CO2: 26 mmol/L (ref 20–29)
Calcium: 9.9 mg/dL (ref 8.7–10.2)
Chloride: 101 mmol/L (ref 96–106)
Creatinine, Ser: 0.73 mg/dL (ref 0.57–1.00)
Globulin, Total: 2.8 g/dL (ref 1.5–4.5)
Glucose: 101 mg/dL — ABNORMAL HIGH (ref 65–99)
Potassium: 4.3 mmol/L (ref 3.5–5.2)
Sodium: 140 mmol/L (ref 134–144)
Total Protein: 7.5 g/dL (ref 6.0–8.5)
eGFR: 106 mL/min/{1.73_m2} (ref >59)

## 2020-10-26 LAB — HEPATITIS C ANTIBODY: Hep C Virus Ab: <0.1 s/co ratio (ref 0.0–0.9)

## 2020-10-26 LAB — CHLAMYDIA/GONOCOCCUS/TRICHOMONAS, NAA
Chlamydia by NAA: NEGATIVE
Gonococcus by NAA: NEGATIVE
Trich vag by NAA: NEGATIVE

## 2020-10-26 LAB — HIV ANTIBODY (ROUTINE TESTING W REFLEX): HIV Screen 4th Generation wRfx: NONREACTIVE

## 2020-10-26 LAB — RPR: RPR Ser Ql: NONREACTIVE

## 2020-11-01 NOTE — Progress Notes (Signed)
Christina Cunningham,   Mammogram looks good! No signs of cancer. We will recheck in one year.   SaraBeth

## 2020-11-07 ENCOUNTER — Telehealth (HOSPITAL_BASED_OUTPATIENT_CLINIC_OR_DEPARTMENT_OTHER): Payer: Self-pay

## 2020-11-07 NOTE — Telephone Encounter (Signed)
-----   Message from Orma Render, NP sent at 11/07/2020  7:34 AM EDT ----- Please call patient:  Hemoglobin A1c shows pre-diabetic range. For now, I recommend that you decrease the carbohydrates and sugars in your diet- some of the best ways to do this are avoid sweetened beverages and eat sweet snacks in moderation.  Potatoes, pasta, breads, and chips have a lot of carbohydrates so limit these items.  I also recommend purposeful walking 20 minutes every day at a swift pace to where you can still carry on a conversation, but get your heart rate elevated.   There is a bump in one of your liver enzymes, but overall they look pretty good. We can plan to recheck this in about 6 months to make sure it has come down to normal.  (Please schedule 6 month nurse visit for CMP- fasting).  Your cholesterol looks great!! Keep up the great work!  All STI testing as negative. If you have any symptoms or new sexual partners and wish for further testing, please let us know.   Have a great week! SaraBeth

## 2020-11-07 NOTE — Progress Notes (Signed)
Please call patient:  Hemoglobin A1c shows pre-diabetic range. For now, I recommend that you decrease the carbohydrates and sugars in your diet- some of the best ways to do this are avoid sweetened beverages and eat sweet snacks in moderation.  Potatoes, pasta, breads, and chips have a lot of carbohydrates so limit these items.  I also recommend purposeful walking 20 minutes every day at a swift pace to where you can still carry on a conversation, but get your heart rate elevated.   There is a bump in one of your liver enzymes, but overall they look pretty good. We can plan to recheck this in about 6 months to make sure it has come down to normal.  (Please schedule 6 month nurse visit for CMP- fasting).  Your cholesterol looks great!! Keep up the great work!  All STI testing as negative. If you have any symptoms or new sexual partners and wish for further testing, please let us know.   Have a great week! SaraBeth

## 2020-11-07 NOTE — Telephone Encounter (Signed)
Results released by Christina Cunningham, AGNP and called patient to discuss lab results and recommendations.  Patient is aware and agrees with recommendations.  Patient is scheduled on 04/11/21 at 1:40 pm for CMP-fasting. Instructed patient to contact the office with any questions or concerns.

## 2020-11-11 ENCOUNTER — Ambulatory Visit (HOSPITAL_BASED_OUTPATIENT_CLINIC_OR_DEPARTMENT_OTHER): Payer: 59

## 2021-01-20 ENCOUNTER — Other Ambulatory Visit: Payer: Self-pay

## 2021-01-20 ENCOUNTER — Other Ambulatory Visit (HOSPITAL_COMMUNITY)
Admission: RE | Admit: 2021-01-20 | Discharge: 2021-01-20 | Disposition: A | Discharge status: Home / Self Care | Payer: 59 | Source: Ambulatory Visit | Attending: Nurse Practitioner | Admitting: Nurse Practitioner

## 2021-01-20 ENCOUNTER — Encounter (HOSPITAL_BASED_OUTPATIENT_CLINIC_OR_DEPARTMENT_OTHER): Payer: Self-pay | Admitting: Nurse Practitioner

## 2021-01-20 ENCOUNTER — Ambulatory Visit (HOSPITAL_BASED_OUTPATIENT_CLINIC_OR_DEPARTMENT_OTHER): Payer: 59 | Admitting: Nurse Practitioner

## 2021-01-20 VITALS — BP 129/61 | HR 67 | Ht 64.0 in | Wt 147.6 lb

## 2021-01-20 DIAGNOSIS — B9689 Other specified bacterial agents as the cause of diseases classified elsewhere: Secondary | ICD-10-CM | POA: Diagnosis not present

## 2021-01-20 DIAGNOSIS — B373 Candidiasis of vulva and vagina: Secondary | ICD-10-CM | POA: Diagnosis not present

## 2021-01-20 DIAGNOSIS — N760 Acute vaginitis: Secondary | ICD-10-CM

## 2021-01-20 DIAGNOSIS — N898 Other specified noninflammatory disorders of vagina: Secondary | ICD-10-CM | POA: Diagnosis not present

## 2021-01-20 DIAGNOSIS — B3731 Acute candidiasis of vulva and vagina: Secondary | ICD-10-CM

## 2021-01-20 DIAGNOSIS — M654 Radial styloid tenosynovitis [de Quervain]: Secondary | ICD-10-CM

## 2021-01-20 HISTORY — DX: Other specified noninflammatory disorders of vagina: N89.8

## 2021-01-20 MED ORDER — KETOROLAC TROMETHAMINE 60 MG/2ML IM SOLN
60.0000 mg | Freq: Once | INTRAMUSCULAR | Status: AC
  Administered 2021-01-20: 60 mg via INTRAMUSCULAR

## 2021-01-20 NOTE — Patient Instructions (Signed)
Recommendations from today's visit: Ibuprofen 600-800mg  every 8 hours for swelling and pain.  Keep the wrist wrapped while you are working and when you sleep to help protect it.  Ice the area 20 minutes at a time twice a day We will have you come back on Monday or Tuesday afternoon to have Dr. Burnard Bunting look at it and give you a steroid shot if its still looking bad.

## 2021-01-20 NOTE — Assessment & Plan Note (Addendum)
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  Fracture unlikely due lack of injury or trauma to the area at onset of symptoms- will avoid imaging at this time, but will consider if symptoms not improved over the weekend.  Positive Finkelstein test with significant weakness and decrease in ROM of the radial side of the wrist. Tenderness present with minimal pressure along the radial portion of the distal forearm, radial styloid, and into the thumb.   Circulation intact.  60mg  Toradol IM today. Spica wrist wrap with elastic bandage and wrapped in co-ban for added support and protection.  Education on ice, NSAIDs, elevation, and RTC provided.  Plan to RTC Christina Cunningham next week for evaluation with Dr. Burnard Bunting for possible steroid injection. Will consider spica cast at that time if symptoms not improved.

## 2021-01-20 NOTE — Progress Notes (Signed)
Acute Office Visit  Subjective:    Patient ID: Christina Cunningham, female    DOB: [birth year], 40 y.o.   MRN: [id number]  Chief Complaint  Patient presents with   Wrist Pain    Patient states her left is hurting x 8weeks.  Cannot recall injuring it, but it is swollen.  Pain level is 9.  Patient states she has a discharge  since Tuesday.  She states the discharge is thick, greenish and yellow, but no odor.    HPI Patient is in today for wrist pain and vaginal discharge.  Vaginal discharge Reports noticing increased thick vaginal discharge on Tuesday with yellow/green coloration.  She denies odor, dysuria, bleeding, pelvic pain, abdominal pain, fever, chills, nausea, or itching.  No new sexual partners.  No history of similar.   Wrist Pain Reports wrist pain for 8 weeks to the distal left lateral wrist and into the thumb.  Progressively worsening with increased swelling, pain, and limited ROM.  Endorses significant pain with minimal pressure to the skin.  Initially thought she may have slept wrong, but symptoms not consistent with that per patient.  She is right handed and denies repetitive use or movement of the left wrist or thumb.  Has had a soft brace on the thumb that seemed to help some, but pain returned as soon as it was removed.  Pain 9/10 today   Review of Systems All review of systems negative except what is listed in the HPI     Objective:    Physical Exam Vitals and nursing note reviewed. Exam conducted with a chaperone present.  Constitutional:      Appearance: Normal appearance.  HENT:     Head: Normocephalic.  Eyes:     Extraocular Movements: Extraocular movements intact.     Conjunctiva/sclera: Conjunctivae normal.     Pupils: Pupils are equal, round, and reactive to light.  Cardiovascular:     Rate and Rhythm: Normal rate.     Pulses: Normal pulses.  Pulmonary:     Effort: Pulmonary effort is normal.  Abdominal:     General: Abdomen is flat. Bowel  sounds are normal. There is no distension.     Palpations: Abdomen is soft.     Tenderness: There is no abdominal tenderness. There is no guarding or rebound.  Genitourinary:    Exam position: Lithotomy position.     Labia:        Right: Lesion present.      Vagina: Vaginal discharge, erythema and tenderness present.     Cervix: Discharge and erythema present.     Uterus: Normal. Not tender.      Adnexa: Right adnexa normal.       Left: No tenderness.      Musculoskeletal:     Right wrist: Normal.     Left wrist: Swelling and tenderness present. Decreased range of motion. Normal pulse.     Right hand: Normal.     Left hand: Swelling, tenderness and bony tenderness present. No deformity. Decreased range of motion. Decreased strength of finger abduction and thumb/finger opposition. Normal sensation. Normal capillary refill. Normal pulse.  Skin:    General: Skin is warm and dry.     Capillary Refill: Capillary refill takes less than 2 seconds.     Findings: Bruising present.  Neurological:     General: No focal deficit present.     Mental Status: She is alert and oriented to person, place, and time.  Psychiatric:  Mood and Affect: Mood normal.        Behavior: Behavior normal.        Thought Content: Thought content normal.        Judgment: Judgment normal.      Assessment & Plan:   Problem List Items Addressed This Visit     Vaginal discharge - Primary   Relevant Orders   Cervicovaginal ancillary only   Tendinitis, de Quervain's    Symptoms and presentation consistent with de Quervain's tendonitis .  Fracture unlikely due lack of injury or trauma to the area at onset of symptoms.  Positive Finkelstein test with significant weakness and decrease in ROM of the radial side of the wrist. Tenderness present with minimal pressure along the radial portion of the distal forearm, radial styloid, and into the thumb.   60mg  Toradol IM today. Spica wrist wrap with elastic bandage  and wrapped in co-ban for added support and protection.  Education on ice, NSAIDs, elevation, and RTC provided.  Plan to RTC [facility] next week for evaluation with Dr. Burnard Bunting for possible steroid injection. Will consider spica cast at that time if symptoms not improved.        Meds ordered this encounter  Medications   ketorolac (TORADOL) injection 60 mg   F/U Monday or Tuesday with Dr. Burnard Bunting for injection. Consider spica casting if necessary.   Orma Render, NP

## 2021-01-20 NOTE — Addendum Note (Signed)
Addended by: Shalen Petrak, Clarise Cruz E on: 01/20/2021 05:47 PM   Modules accepted: Level of Service

## 2021-01-23 ENCOUNTER — Ambulatory Visit (INDEPENDENT_AMBULATORY_CARE_PROVIDER_SITE_OTHER): Payer: 59 | Admitting: Family Medicine

## 2021-01-23 ENCOUNTER — Other Ambulatory Visit: Payer: Self-pay

## 2021-01-23 ENCOUNTER — Other Ambulatory Visit (HOSPITAL_BASED_OUTPATIENT_CLINIC_OR_DEPARTMENT_OTHER): Payer: Self-pay

## 2021-01-23 DIAGNOSIS — M654 Radial styloid tenosynovitis [de Quervain]: Secondary | ICD-10-CM

## 2021-01-23 LAB — CERVICOVAGINAL ANCILLARY ONLY
Bacterial Vaginitis (gardnerella): POSITIVE — AB
Candida Glabrata: NEGATIVE
Candida Vaginitis: POSITIVE — AB
Chlamydia: NEGATIVE
Comment: NEGATIVE
Comment: NEGATIVE
Comment: NEGATIVE
Comment: NEGATIVE
Comment: NEGATIVE
Comment: NORMAL
Neisseria Gonorrhea: NEGATIVE
Trichomonas: NEGATIVE

## 2021-01-23 MED ORDER — DICLOFENAC SODIUM 1 % EX GEL: 2.0000 g | Freq: Four times a day (QID) | CUTANEOUS | 1 refills | Status: DC

## 2021-01-23 MED ORDER — FLUCONAZOLE 150 MG PO TABS
ORAL_TABLET | ORAL | 1 refills | Status: DC
  Filled 2021-01-23: qty 2, 3d supply, fill #0

## 2021-01-23 MED ORDER — METRONIDAZOLE 500 MG PO TABS
500.0000 mg | ORAL_TABLET | Freq: Two times a day (BID) | ORAL | 0 refills | Status: DC
  Filled 2021-01-23: qty 10, 5d supply, fill #0

## 2021-01-23 NOTE — Progress Notes (Signed)
    Procedures performed today:    None.  Independent interpretation of notes and tests performed by another provider:   None.  Brief History, Exam, Impression, and Recommendations:    Tendinitis, de Quervain's 40 year old female who is a patient of Christina Cunningham She presents today for evaluation of left wrist pain Symptoms have been going on for about 6 to 8 weeks, has worsened over the past 1 to 2 weeks pain is mostly over radial aspect of wrist, however due to increased pain and soreness is fairly diffuse about the wrist Saw PCP last week and was given Toradol injection and had wrist wrapped in Ace bandage Has been taking Aleve to help with pain Pain is worse with movement, lifting Patient is right-handed. On exam, patient with notable tenderness palpation along first dorsal compartment primarily, however tenderness extends to mid dorsal wrist as well as extending a few centimeters proximally.  She does have swelling through metacarpals and along some of the digits.  Neurovascular exam intact. History and exam suggest de Quervain's tenosynovitis as most likely underlying cause of symptoms Discussed diagnosis, treatment options with patient Elected to proceed with conservative measures including thumb spica splint, topical diclofenac at this time Will also order x-rays of left wrist given swelling, notable symptoms, reduced range of motion Discussed option of tendon sheath progress steroid injection, patient wishes to hold off at this time which is reasonable Plan for follow-up in about 3 weeks or sooner as needed, particularly if she decides that she would like to proceed with injection   ___________________________________________ Christina Hargan de [place], MD, ABFM, CAQSM [facility] [place]

## 2021-01-23 NOTE — Patient Instructions (Addendum)
  Medication Instructions:  Your physician has recommended you make the following change in your medication:  -- START Applying Voltaren gel topically to the affected area 4 times daily --If you need a refill on any your medications before your next appointment, please call your pharmacy first. If no refills are authorized on file call the office.--  Follow-Up: Your next appointment:   Your physician recommends that you schedule a follow-up appointment in: 3 WEEKS with Dr. de [place]      Thanks for letting us be apart of your health journey!!        [facility]   Dr. Raymond de [place] II., MD, MPH               We recommend signing up for the patient portal called "MyChart".  Sign up information is provided on this After Visit Summary.  MyChart is used to connect with patients for Virtual Visits (Telemedicine).  Patients are able to view lab/test results, encounter notes, upcoming appointments, etc.  Non-urgent messages can be sent to your provider as well.   To learn more about what you can do with MyChart, please visit --  [email or web address].

## 2021-01-23 NOTE — Assessment & Plan Note (Signed)
40 year old female who is a patient of Sarabeth's She presents today for evaluation of left wrist pain Symptoms have been going on for about 6 to 8 weeks, has worsened over the past 1 to 2 weeks pain is mostly over radial aspect of wrist, however due to increased pain and soreness is fairly diffuse about the wrist Saw PCP last week and was given Toradol injection and had wrist wrapped in Ace bandage Has been taking Aleve to help with pain Pain is worse with movement, lifting Patient is right-handed. On exam, patient with notable tenderness palpation along first dorsal compartment primarily, however tenderness extends to mid dorsal wrist as well as extending a few centimeters proximally.  She does have swelling through metacarpals and along some of the digits.  Neurovascular exam intact. History and exam suggest de Quervain's tenosynovitis as most likely underlying cause of symptoms Discussed diagnosis, treatment options with patient Elected to proceed with conservative measures including thumb spica splint, topical diclofenac at this time Will also order x-rays of left wrist given swelling, notable symptoms, reduced range of motion Discussed option of tendon sheath progress steroid injection, patient wishes to hold off at this time which is reasonable Plan for follow-up in about 3 weeks or sooner as needed, particularly if she decides that she would like to proceed with injection

## 2021-01-23 NOTE — Addendum Note (Signed)
Addended by: Terrence Pizana, Clarise Cruz E on: 01/23/2021 05:38 PM   Modules accepted: Orders

## 2021-01-24 ENCOUNTER — Encounter (HOSPITAL_BASED_OUTPATIENT_CLINIC_OR_DEPARTMENT_OTHER): Payer: Self-pay

## 2021-01-24 ENCOUNTER — Telehealth (HOSPITAL_BASED_OUTPATIENT_CLINIC_OR_DEPARTMENT_OTHER): Payer: Self-pay

## 2021-01-24 DIAGNOSIS — B9689 Other specified bacterial agents as the cause of diseases classified elsewhere: Secondary | ICD-10-CM

## 2021-01-24 MED ORDER — CLINDAMYCIN HCL 300 MG PO CAPS
300.0000 mg | ORAL_CAPSULE | Freq: Two times a day (BID) | ORAL | 0 refills | Status: AC
Start: 2021-01-24 — End: 2021-02-01
  Filled 2021-01-24 – 2021-01-25 (×2): qty 14, 7d supply, fill #0

## 2021-01-24 MED ORDER — CLINDAMYCIN HCL 300 MG PO CAPS
300.0000 mg | ORAL_CAPSULE | Freq: Two times a day (BID) | ORAL | 0 refills | Status: DC
Start: 2021-01-24 — End: 2021-01-24

## 2021-01-24 NOTE — Telephone Encounter (Signed)
-----   Message from Orma Render, NP sent at 01/23/2021  5:38 PM EDT ----- [name] news! Your test came back positive for bacterial vaginosis and yeast. Both of these are caused from changes in the pH of the vagina and an overgrowth of organisms that normally live there. I will send treatment for both. It is important that you do not drink alcohol while on the medication as this can really upset your stomach. If you would rather me send in the medication that apply inside the vagina rather than a pill, I can do that. Just let me know.  Christina Cunningham

## 2021-01-24 NOTE — Telephone Encounter (Signed)
Called patient to discuss results. Patient it aware. Patient informed me Flagyl (metronidazole) causes vomiting and nausea an would like an alternative medication sent in to her pharmacy. Instructed patient to contact the office with questions or concerns.

## 2021-01-25 ENCOUNTER — Telehealth (HOSPITAL_BASED_OUTPATIENT_CLINIC_OR_DEPARTMENT_OTHER): Payer: Self-pay

## 2021-01-25 ENCOUNTER — Other Ambulatory Visit (HOSPITAL_BASED_OUTPATIENT_CLINIC_OR_DEPARTMENT_OTHER): Payer: Self-pay

## 2021-01-25 NOTE — Telephone Encounter (Signed)
Called patient to inform her Cleocin (clindamycin) was sent to her pharmacy to replace the Flaygl (metronidazole).  Patient is aware.

## 2021-02-13 ENCOUNTER — Ambulatory Visit (HOSPITAL_BASED_OUTPATIENT_CLINIC_OR_DEPARTMENT_OTHER): Payer: 59 | Admitting: Family Medicine

## 2021-03-03 ENCOUNTER — Encounter (HOSPITAL_BASED_OUTPATIENT_CLINIC_OR_DEPARTMENT_OTHER): Payer: Self-pay | Admitting: Family Medicine

## 2021-04-11 ENCOUNTER — Ambulatory Visit (HOSPITAL_BASED_OUTPATIENT_CLINIC_OR_DEPARTMENT_OTHER): Payer: 59

## 2021-04-11 ENCOUNTER — Other Ambulatory Visit (HOSPITAL_BASED_OUTPATIENT_CLINIC_OR_DEPARTMENT_OTHER): Payer: Self-pay

## 2021-04-11 DIAGNOSIS — N951 Menopausal and female climacteric states: Secondary | ICD-10-CM

## 2021-05-13 ENCOUNTER — Ambulatory Visit (HOSPITAL_COMMUNITY)
Admission: EM | Admit: 2021-05-13 | Discharge: 2021-05-13 | Disposition: A | Discharge status: Home / Self Care | Payer: 59 | Attending: Physician Assistant | Admitting: Physician Assistant

## 2021-05-13 ENCOUNTER — Other Ambulatory Visit: Payer: Self-pay

## 2021-05-13 ENCOUNTER — Encounter (HOSPITAL_COMMUNITY): Payer: Self-pay

## 2021-05-13 DIAGNOSIS — N3090 Cystitis, unspecified without hematuria: Secondary | ICD-10-CM

## 2021-05-13 LAB — POCT URINALYSIS DIPSTICK, ED / UC
Bilirubin Urine: NEGATIVE
Glucose, UA: NEGATIVE mg/dL
Ketones, ur: NEGATIVE mg/dL
Nitrite: NEGATIVE
Protein, ur: NEGATIVE mg/dL
Specific Gravity, Urine: <=1.005 (ref 1.005–1.030)
Urobilinogen, UA: 0.2 mg/dL (ref 0.0–1.0)
pH: 5.5 (ref 5.0–8.0)

## 2021-05-13 MED ORDER — NITROFURANTOIN MONOHYD MACRO 100 MG PO CAPS: 100.0000 mg | ORAL_CAPSULE | Freq: Two times a day (BID) | ORAL | 0 refills | Status: DC

## 2021-05-13 NOTE — Discharge Instructions (Addendum)
Take medication as prescribed Will call with lab results Drink plenty of fluids Return if no improvement
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
## 2021-05-13 NOTE — ED Triage Notes (Signed)
Pt presents with Dysuria x 3 days.

## 2021-05-15 ENCOUNTER — Telehealth (HOSPITAL_COMMUNITY): Payer: Self-pay | Admitting: Emergency Medicine

## 2021-05-15 LAB — CERVICOVAGINAL ANCILLARY ONLY
Bacterial Vaginitis (gardnerella): POSITIVE — AB
Candida Glabrata: NEGATIVE
Candida Vaginitis: POSITIVE — AB
Chlamydia: NEGATIVE
Comment: NEGATIVE
Comment: NEGATIVE
Comment: NEGATIVE
Comment: NEGATIVE
Comment: NEGATIVE
Comment: NORMAL
Neisseria Gonorrhea: NEGATIVE
Trichomonas: NEGATIVE

## 2021-05-15 LAB — URINE CULTURE: Culture: >=100000 — AB

## 2021-05-15 MED ORDER — CEPHALEXIN 500 MG PO CAPS: 500.0000 mg | ORAL_CAPSULE | Freq: Two times a day (BID) | ORAL | 0 refills | Status: AC

## 2021-05-15 MED ORDER — FLUCONAZOLE 150 MG PO TABS: 150.0000 mg | ORAL_TABLET | Freq: Once | ORAL | 0 refills | Status: AC

## 2021-05-15 MED ORDER — CLINDAMYCIN HCL 150 MG PO CAPS: 300.0000 mg | ORAL_CAPSULE | Freq: Two times a day (BID) | ORAL | 0 refills | Status: AC

## 2021-07-20 ENCOUNTER — Ambulatory Visit (INDEPENDENT_AMBULATORY_CARE_PROVIDER_SITE_OTHER): Payer: 59 | Admitting: Nurse Practitioner

## 2021-07-20 ENCOUNTER — Encounter (HOSPITAL_BASED_OUTPATIENT_CLINIC_OR_DEPARTMENT_OTHER): Payer: Self-pay | Admitting: Nurse Practitioner

## 2021-07-20 ENCOUNTER — Other Ambulatory Visit: Payer: Self-pay

## 2021-07-20 DIAGNOSIS — L409 Psoriasis, unspecified: Secondary | ICD-10-CM

## 2021-07-20 DIAGNOSIS — M62838 Other muscle spasm: Secondary | ICD-10-CM

## 2021-07-20 DIAGNOSIS — N898 Other specified noninflammatory disorders of vagina: Secondary | ICD-10-CM | POA: Diagnosis not present

## 2021-07-20 HISTORY — DX: Other muscle spasm: M62.838

## 2021-07-20 MED ORDER — CALCIPOTRIENE 0.005 % EX CREA: TOPICAL_CREAM | Freq: Two times a day (BID) | CUTANEOUS | 3 refills | Status: DC

## 2021-07-20 MED ORDER — MELOXICAM 15 MG PO TABS: 15.0000 mg | ORAL_TABLET | Freq: Every day | ORAL | 0 refills | Status: DC

## 2021-07-20 MED ORDER — CYCLOBENZAPRINE HCL 5 MG PO TABS: 5.0000 mg | ORAL_TABLET | Freq: Three times a day (TID) | ORAL | 0 refills | Status: DC | PRN

## 2021-07-20 MED ORDER — FLUCONAZOLE 150 MG PO TABS: 150.0000 mg | ORAL_TABLET | Freq: Every day | ORAL | 3 refills | Status: AC

## 2021-07-20 NOTE — Patient Instructions (Signed)
If your vaginal symptoms do not get better please let us know.  We can have you come in for a vaginal self swab so that we can send it off and make sure that this is in fact a yeast infection. ? ?If your rib pain does not improve please let me know.  We may need to have an in person evaluation to make sure that nothing else is going on besides a muscle strain. ?

## 2021-07-20 NOTE — Assessment & Plan Note (Signed)
Symptoms and presentation consistent with spasming of the muscle of the left rib cage related to recent strain injury. ?We will send in meloxicam and cyclobenzaprine for as needed use. ?Recommend gentle stretching exercises and ice to help with rehabilitation. ?No alarm symptoms present today. ?If pain continues or worsens recommend follow-up for in person evaluation. ?

## 2021-07-20 NOTE — Assessment & Plan Note (Signed)
Recent improvement with use of Dovonox. Appears this medication has been sent in the past, but was not picked up from the pharmacy. Will send the rx in for patient.  ?Continue use and notify if sx worsen or fail to improve.  ?

## 2021-07-20 NOTE — Assessment & Plan Note (Signed)
Symptoms and presentation consistent with previous infections for candidal vaginosis. ?We will send treatment in today for fluconazole high-dose treatment in order to help eradicate current infection. ?If symptoms persist or fail to improve with current treatment methods recommend patient come in for vaginal self swab or vaginal exam for appropriate treatment measures. ? ?

## 2021-07-20 NOTE — Progress Notes (Signed)
Virtual Visit Encounter telephone visit. ? ? ?I connected with  Christina Cunningham on 07/20/21 at  3:50 PM EDT by secure audio enabled telemedicine application. I verified that I am speaking with the correct person using two identifiers. ?  ?I introduced myself as a Designer, jewellery with the practice. The limitations of evaluation and management by telemedicine discussed with the patient and the availability of in person appointments. The patient expressed verbal understanding and consent to proceed. ? ?Participating parties in this visit include: Myself and patient ? ?The patient is: Patient Location: Home ?I am: Provider Location: Office/Clinic ?Subjective:   ? ?CC and HPI: Christina Cunningham is a 41 y.o. year old female presenting for new evaluation and treatment of left rib pain, vaginal discharge, and psoriasis.  ? ?Left Rib Pain ?- pain started with recent physical activity  ?- worse with stretching with arms above the head and leaning to the right side ?- muscle tenderness in the area and spasms in the thoracic region of the back ?- pain causing her to catch her breath ?- no ShOB, ecchymosis, edema reported ? ?Vaginal Discharge ?- recurrence of thick, white, non-odorous vaginal discharge consistent with candidiasis that patient has had in the pat ?- history of similar infection with same symptoms treated with diflucan successfully. ?- no risk of STI ? ?Psoriasis ?- ran out of clobetasol cream and used a cream her mother had for the same condition.  ?- reports her mothers cream works substantially better and she would like to try this.  ?- no new symptoms, rash consistent and intermittent ?- well controlled at this time ? ?Past medical history, Surgical history, Family history not pertinant except as noted below, Social history, Allergies, and medications have been entered into the medical record, reviewed, and corrections made.  ? ?Review of Systems:  ?All review of systems negative except what is listed in the  HPI ? ?Objective:   ? ?Alert and oriented x 4 ?Speaking in clear sentences with no shortness of breath. ?No distress. ? ?Impression and Recommendations:   ? ?Problem List Items Addressed This Visit   ? ? Psoriasis  ?  Recent improvement with use of Dovonox. Appears this medication has been sent in the past, but was not picked up from the pharmacy. Will send the rx in for patient.  ?Continue use and notify if sx worsen or fail to improve.  ?  ?  ? Relevant Medications  ? calcipotriene (DOVONOX) 0.005 % cream  ? Vaginal discharge - Primary  ?  Symptoms and presentation consistent with previous infections for candidal vaginosis. ?We will send treatment in today for fluconazole high-dose treatment in order to help eradicate current infection. ?If symptoms persist or fail to improve with current treatment methods recommend patient come in for vaginal self swab or vaginal exam for appropriate treatment measures. ? ?  ?  ? Relevant Medications  ? fluconazole (DIFLUCAN) 150 MG tablet  ? Muscle spasm  ?  Symptoms and presentation consistent with spasming of the muscle of the left rib cage related to recent strain injury. ?We will send in meloxicam and cyclobenzaprine for as needed use. ?Recommend gentle stretching exercises and ice to help with rehabilitation. ?No alarm symptoms present today. ?If pain continues or worsens recommend follow-up for in person evaluation. ?  ?  ? Relevant Medications  ? meloxicam (MOBIC) 15 MG tablet  ? cyclobenzaprine (FLEXERIL) 5 MG tablet  ? ? ?orders and follow up as documented in EMR ?I discussed  the assessment and treatment plan with the patient. The patient was provided an opportunity to ask questions and all were answered. The patient agreed with the plan and demonstrated an understanding of the instructions. ?  ?The patient was advised to call back or seek an in-person evaluation if the symptoms worsen or if the condition fails to improve as anticipated. ? ?Follow-Up: prn ? ?I provided  20 minutes of non-face-to-face interaction with this non face-to-face encounter including intake, same-day documentation, and chart review.  ? ?Orma Render, NP , DNP, AGNP-c ?[facility] ?Primary Care & Sports Medicine at [facility] ?[phone] ?[phone] (fax) ? ?

## 2021-08-05 ENCOUNTER — Emergency Department (HOSPITAL_BASED_OUTPATIENT_CLINIC_OR_DEPARTMENT_OTHER)
Admission: EM | Admit: 2021-08-05 | Discharge: 2021-08-05 | Disposition: A | Discharge status: Home / Self Care | Payer: 59 | Attending: Emergency Medicine | Admitting: Emergency Medicine

## 2021-08-05 ENCOUNTER — Encounter (HOSPITAL_BASED_OUTPATIENT_CLINIC_OR_DEPARTMENT_OTHER): Payer: Self-pay | Admitting: Emergency Medicine

## 2021-08-05 ENCOUNTER — Emergency Department (HOSPITAL_BASED_OUTPATIENT_CLINIC_OR_DEPARTMENT_OTHER): Payer: 59 | Admitting: Radiology

## 2021-08-05 ENCOUNTER — Other Ambulatory Visit: Payer: Self-pay

## 2021-08-05 DIAGNOSIS — S79912A Unspecified injury of left hip, initial encounter: Secondary | ICD-10-CM | POA: Insufficient documentation

## 2021-08-05 DIAGNOSIS — Y92410 Unspecified street and highway as the place of occurrence of the external cause: Secondary | ICD-10-CM | POA: Diagnosis not present

## 2021-08-05 DIAGNOSIS — S6992XA Unspecified injury of left wrist, hand and finger(s), initial encounter: Secondary | ICD-10-CM | POA: Diagnosis not present

## 2021-08-05 DIAGNOSIS — M25552 Pain in left hip: Secondary | ICD-10-CM | POA: Diagnosis not present

## 2021-08-05 DIAGNOSIS — M25532 Pain in left wrist: Secondary | ICD-10-CM | POA: Diagnosis not present

## 2021-08-05 LAB — PREGNANCY, URINE: Preg Test, Ur: NEGATIVE

## 2021-08-05 MED ORDER — NAPROXEN 250 MG PO TABS
500.0000 mg | ORAL_TABLET | ORAL | Status: AC
  Administered 2021-08-05: 500 mg via ORAL
  Filled 2021-08-05: qty 2

## 2021-08-05 MED ORDER — ACETAMINOPHEN 500 MG PO TABS
1000.0000 mg | ORAL_TABLET | Freq: Once | ORAL | Status: AC
Start: 2021-08-05 — End: 2021-08-05
  Administered 2021-08-05: 1000 mg via ORAL
  Filled 2021-08-05: qty 2

## 2021-08-05 MED ORDER — NAPROXEN 375 MG PO TABS: 375.0000 mg | ORAL_TABLET | Freq: Two times a day (BID) | ORAL | 0 refills | Status: DC

## 2021-08-05 MED ORDER — LIDOCAINE 5 % EX PTCH
2.0000 | MEDICATED_PATCH | CUTANEOUS | Status: DC
  Administered 2021-08-05: 2 via TRANSDERMAL
  Filled 2021-08-05: qty 2

## 2021-08-05 MED ORDER — LIDOCAINE 5 % EX PTCH: 1.0000 | MEDICATED_PATCH | CUTANEOUS | 0 refills | Status: DC
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
## 2021-08-05 NOTE — ED Notes (Signed)
Patient transported to X-ray 

## 2021-11-28 ENCOUNTER — Ambulatory Visit (HOSPITAL_BASED_OUTPATIENT_CLINIC_OR_DEPARTMENT_OTHER): Payer: 59 | Admitting: Family Medicine

## 2021-11-28 ENCOUNTER — Other Ambulatory Visit (HOSPITAL_BASED_OUTPATIENT_CLINIC_OR_DEPARTMENT_OTHER): Payer: Self-pay

## 2021-11-28 DIAGNOSIS — R8290 Unspecified abnormal findings in urine: Secondary | ICD-10-CM

## 2021-11-28 DIAGNOSIS — M62838 Other muscle spasm: Secondary | ICD-10-CM

## 2021-11-28 DIAGNOSIS — L409 Psoriasis, unspecified: Secondary | ICD-10-CM | POA: Diagnosis not present

## 2021-11-28 DIAGNOSIS — W57XXXA Bitten or stung by nonvenomous insect and other nonvenomous arthropods, initial encounter: Secondary | ICD-10-CM

## 2021-11-28 HISTORY — DX: Bitten or stung by nonvenomous insect and other nonvenomous arthropods, initial encounter: W57.XXXA

## 2021-11-28 HISTORY — DX: Unspecified abnormal findings in urine: R82.90

## 2021-11-28 MED ORDER — MELOXICAM 15 MG PO TABS
15.0000 mg | ORAL_TABLET | Freq: Every day | ORAL | 0 refills | Status: DC
  Filled 2021-11-28: qty 30, 30d supply, fill #0

## 2021-11-28 MED ORDER — NITROFURANTOIN MONOHYD MACRO 100 MG PO CAPS
100.0000 mg | ORAL_CAPSULE | Freq: Two times a day (BID) | ORAL | 0 refills | Status: AC
  Filled 2021-11-28: qty 10, 5d supply, fill #0

## 2021-11-28 NOTE — Assessment & Plan Note (Signed)
History of psoriasis, currently has active lesions over right elbow primarily, left elbow with smaller lesion.  She previously was following with [facility] dermatologist.  More recently has been managed by PCP.  She is requesting referral to [facility] provider, not aware of specific provider that she would like referral to. We can proceed with referral as requested

## 2021-11-28 NOTE — Progress Notes (Signed)
    Procedures performed today:    None.  Independent interpretation of notes and tests performed by another provider:   None.  Brief History, Exam, Impression, and Recommendations:    BP 142/78, pulse 98, weight 149.2 pounds, height 5'5"  Malodorous urine Patient reports that for the past couple weeks or so she has been noticing a bad odor to her urine.  She has not noticed any blood in the urine.  Denies any abdominal pain, fever, chills, sweats.  Denies any dysuria or urinary frequency. On exam, patient is in no acute distress, vital signs are stable.  Abdomen with normal bowel sounds, soft, nontender, nondistended. UA completed with negative glucose and ketones, trace blood, trace leukocyte esterase, negative nitrites Discussed urine sample testing with patient and borderline results.  At this time, will begin antibiotic treatment and send urine for culture.  Will adjust antibiotic therapy based on results of urine culture Recommend ensuring adequate hydration  Bug bites Reports having noticed some what appear to be bug bites mostly on right arm, also reporting some ongoing right buttock.  This has been notable over the past week or 2.  Has been applying topical Benadryl as well as taking oral Benadryl.  Has some associated pruritus.  No pain, no significant drainage or weeping.  Does not recall seeing a specific bugs.  She does work primarily outside at work. On exam, she does have areas consistent with insect bites along right upper extremity.  Some excoriations noted, no significant surrounding erythema, no drainage. Discussed areas do appear consistent with bug bites.  Recommend using topical measures such as topical Benadryl or topical hydrocortisone to assist with controlling symptoms.  Can utilize oral would expect gradual improvement over the week or so.  Did discuss utilizing appropriate clothing which may help to limit exposed skin  Psoriasis History of psoriasis, currently  has active lesions over right elbow primarily, left elbow with smaller lesion.  She previously was following with [facility] dermatologist.  More recently has been managed by PCP.  She is requesting referral to [facility] provider, not aware of specific provider that she would like referral to. We can proceed with referral as requested  Return in about 4 weeks (around 12/26/2021) for Follow-up with PCP.   ___________________________________________ Abrar Bilton de [place], MD, ABFM, CAQSM [facility] and [place]

## 2021-11-28 NOTE — Assessment & Plan Note (Addendum)
Patient reports that for the past couple weeks or so she has been noticing a bad odor to her urine.  She has not noticed any blood in the urine.  Denies any abdominal pain, fever, chills, sweats.  Denies any dysuria or urinary frequency. On exam, patient is in no acute distress, vital signs are stable.  Abdomen with normal bowel sounds, soft, nontender, nondistended. UA completed with negative glucose and ketones, trace blood, trace leukocyte esterase, negative nitrites Discussed urine sample testing with patient and borderline results.  At this time, will begin antibiotic treatment and send urine for culture.  Will adjust antibiotic therapy based on results of urine culture Recommend ensuring adequate hydration

## 2021-11-28 NOTE — Assessment & Plan Note (Signed)
Reports having noticed some what appear to be bug bites mostly on right arm, also reporting some ongoing right buttock.  This has been notable over the past week or 2.  Has been applying topical Benadryl as well as taking oral Benadryl.  Has some associated pruritus.  No pain, no significant drainage or weeping.  Does not recall seeing a specific bugs.  She does work primarily outside at work. On exam, she does have areas consistent with insect bites along right upper extremity.  Some excoriations noted, no significant surrounding erythema, no drainage. Discussed areas do appear consistent with bug bites.  Recommend using topical measures such as topical Benadryl or topical hydrocortisone to assist with controlling symptoms.  Can utilize oral would expect gradual improvement over the week or so.  Did discuss utilizing appropriate clothing which may help to limit exposed skin

## 2021-11-30 LAB — URINE CULTURE

## 2021-12-12 ENCOUNTER — Encounter (HOSPITAL_BASED_OUTPATIENT_CLINIC_OR_DEPARTMENT_OTHER): Payer: Self-pay | Admitting: Emergency Medicine

## 2021-12-12 ENCOUNTER — Emergency Department (HOSPITAL_BASED_OUTPATIENT_CLINIC_OR_DEPARTMENT_OTHER)
Admission: EM | Admit: 2021-12-12 | Discharge: 2021-12-12 | Disposition: A | Discharge status: Home / Self Care | Payer: 59 | Attending: Emergency Medicine | Admitting: Emergency Medicine

## 2021-12-12 ENCOUNTER — Other Ambulatory Visit: Payer: Self-pay

## 2021-12-12 DIAGNOSIS — M542 Cervicalgia: Secondary | ICD-10-CM | POA: Diagnosis not present

## 2021-12-12 DIAGNOSIS — J029 Acute pharyngitis, unspecified: Secondary | ICD-10-CM | POA: Insufficient documentation

## 2021-12-12 DIAGNOSIS — R059 Cough, unspecified: Secondary | ICD-10-CM | POA: Insufficient documentation

## 2021-12-12 DIAGNOSIS — K122 Cellulitis and abscess of mouth: Secondary | ICD-10-CM

## 2021-12-12 DIAGNOSIS — B349 Viral infection, unspecified: Secondary | ICD-10-CM | POA: Diagnosis not present

## 2021-12-12 DIAGNOSIS — R221 Localized swelling, mass and lump, neck: Secondary | ICD-10-CM | POA: Insufficient documentation

## 2021-12-12 DIAGNOSIS — Z20822 Contact with and (suspected) exposure to covid-19: Secondary | ICD-10-CM | POA: Diagnosis not present

## 2021-12-12 LAB — GROUP A STREP BY PCR: Group A Strep by PCR: NOT DETECTED

## 2021-12-12 LAB — RESP PANEL BY RT-PCR (FLU A&B, COVID) ARPGX2
Influenza A by PCR: NEGATIVE
Influenza B by PCR: NEGATIVE
SARS Coronavirus 2 by RT PCR: NEGATIVE

## 2021-12-12 MED ORDER — DEXAMETHASONE 10 MG/ML FOR PEDIATRIC ORAL USE
10.0000 mg | Freq: Once | INTRAMUSCULAR | Status: AC
  Administered 2021-12-12: 10 mg via ORAL
  Filled 2021-12-12: qty 1

## 2021-12-12 MED ORDER — ACETAMINOPHEN 160 MG/5ML PO SOLN
650.0000 mg | Freq: Once | ORAL | Status: AC
  Administered 2021-12-12: 650 mg via ORAL
  Filled 2021-12-12: qty 20.3

## 2021-12-12 MED ORDER — IBUPROFEN 400 MG PO TABS
600.0000 mg | ORAL_TABLET | Freq: Once | ORAL | Status: AC
  Administered 2021-12-12: 600 mg via ORAL
  Filled 2021-12-12: qty 1

## 2021-12-12 NOTE — Discharge Instructions (Addendum)
You were seen in the emergency department for your sore throat and neck swelling.  Your uvula was swollen which is likely the cause of your throat pain and you did have some swollen lymph nodes.  You likely have a viral infection causing your symptoms.  We gave you a steroid here in the emergency department that should last for 2 to 3 days and help improve your pain and swelling.  You can also take Tylenol and Motrin as needed for pain and fevers.  You should follow-up with your primary doctor in the next few days to have your symptoms rechecked.  You should return to the emergency department if you are unable to open your mouth, you cannot swallow your saliva, your voice sounds muffled, you have worsening shortness of breath or if you have any other new or concerning symptoms.

## 2021-12-12 NOTE — ED Triage Notes (Signed)
L side sore throat and L side facial swelling since Saturday.

## 2021-12-12 NOTE — ED Notes (Signed)
Reviewed AVS/discharge instruction with patient. Time allotted for and all questions answered. Patient is agreeable for d/c and escorted to ed exit by staff.  

## 2021-12-12 NOTE — ED Provider Notes (Signed)
[place] EMERGENCY DEPT Provider Note   CSN: [id number] Arrival date & time: 12/12/21  1444     History  Chief Complaint  Patient presents with   Sore Throat    Christina Cunningham is a 41 y.o. female.  Patient is a 41 year old female with no significant past medical history presenting to the emergency department with sore throat and left-sided neck pain.  The patient states that she has had throat pain for the last few days and today noticed that the left side of her neck is painful and swollen that prompted her to come to the emergency department.  She states that she felt feverish and chilled at home but is also going through menopause so is unsure if she is having hot flashes or a fever.  She states that she has had a nonproductive cough.  She denies any runny nose or congestion, nausea, vomiting or diarrhea.  She denies any known sick contacts.  She states that she took Motrin for her pain at home this morning with some improvement.   Sore Throat       Home Medications Prior to Admission medications   Medication Sig Start Date End Date Taking? Authorizing Provider  albuterol (VENTOLIN HFA) 108 (90 Base) MCG/ACT inhaler Inhale 2 puffs into the lungs every 6 (six) hours as needed for wheezing or shortness of breath. 10/24/20   Early, Coralee Pesa, NP  calcipotriene (DOVONOX) 0.005 % cream Apply topically 2 (two) times daily. 07/20/21   Orma Render, NP  citalopram (CELEXA) 10 MG tablet Start with '10mg'$  (1 tab) by mouth at bedtime for 7 days then increase to '20mg'$  (2 tabs) by mouth at bedtime. 10/24/20   Orma Render, NP  cyclobenzaprine (FLEXERIL) 5 MG tablet Take 1 tablet (5 mg total) by mouth 3 (three) times daily as needed for muscle spasms. 07/20/21   Orma Render, NP  diclofenac Sodium (VOLTAREN) 1 % GEL Apply 2 g topically 4 (four) times daily. 01/23/21   [place], Blondell Reveal, MD  lidocaine (LIDODERM) 5 % Place 1 patch onto the skin daily. Remove & Discard patch within 12  hours or as directed by MD 08/05/21   Randal Buba, April, MD  meloxicam (MOBIC) 15 MG tablet Take 1 tablet (15 mg total) by mouth daily. 11/28/21   de [place], Raymond J, MD      Allergies    Fish allergy, Shellfish allergy, and Flagyl [metronidazole]    Review of Systems   Review of Systems  Physical Exam Updated Vital Signs BP 121/62 (BP Location: Right Arm)   Pulse 79   Temp 99.3 F (37.4 C) (Oral)   Resp 16   Ht '5\' 5"'$  (1.651 m)   Wt 68 kg   SpO2 97%   BMI 24.96 kg/m  Physical Exam Vitals and nursing note reviewed.  Constitutional:      General: She is not in acute distress.    Appearance: She is well-developed.  HENT:     Head: Normocephalic and atraumatic.     Comments: Bilateral cervical lymphadenopathy with tenderness to palpation No trismus, normal phonation, tolerating secretions    Nose: No congestion or rhinorrhea.     Mouth/Throat:     Mouth: Mucous membranes are moist.     Pharynx: Uvula midline. Uvula swelling (Mild) present. No oropharyngeal exudate or posterior oropharyngeal erythema.     Tonsils: No tonsillar exudate. 0 on the right. 0 on the left.  Eyes:     Conjunctiva/sclera: Conjunctivae  normal.     Pupils: Pupils are equal, round, and reactive to light.  Cardiovascular:     Rate and Rhythm: Normal rate and regular rhythm.     Heart sounds: Normal heart sounds.  Pulmonary:     Effort: Pulmonary effort is normal.     Breath sounds: Normal breath sounds.  Abdominal:     Palpations: Abdomen is soft.     Tenderness: There is no abdominal tenderness.  Musculoskeletal:     Cervical back: Normal range of motion and neck supple.  Lymphadenopathy:     Cervical: Cervical adenopathy present.  Skin:    General: Skin is warm and dry.  Neurological:     General: No focal deficit present.     Mental Status: She is alert and oriented to person, place, and time.  Psychiatric:        Mood and Affect: Mood normal.        Behavior: Behavior normal.     ED Results  / Procedures / Treatments   Labs (all labs ordered are listed, but only abnormal results are displayed) Labs Reviewed  GROUP A STREP BY PCR  RESP PANEL BY RT-PCR (FLU A&B, COVID) [id number]    EKG None  Radiology No results found.  Procedures Procedures    Medications Ordered in ED Medications  dexamethasone (DECADRON) 10 MG/ML injection for Pediatric ORAL use 10 mg (has no administration in time range)  ibuprofen (ADVIL) tablet 600 mg (has no administration in time range)  acetaminophen (TYLENOL) 160 MG/5ML solution 650 mg (650 mg Oral Given 12/12/21 1530)    ED Course/ Medical Decision Making/ A&P Clinical Course as of 12/12/21 1812  Tue Dec 12, 2021  1811 Labs reviewed and interpreted by myself, viral panel is negative and strep is negative.  Patient likely has another viral infection.  She is stable for discharge with outpatient primary care follow-up and is given strict return precautions. [VK]    Clinical Course User Index [VK] Ottie Glazier, DO                           Medical Decision Making This patient presents to the ED with chief complaint(s) of sore throat and neck swelling with no pertinent past medical history which further complicates the presenting complaint. The complaint involves an extensive differential diagnosis and also carries with it a high risk of complications and morbidity.    The differential diagnosis includes tonsillitis, pharyngitis, uveitis, viral URI, patient has no evidence of RPA, PTA or Ludwick's on exam, patient's lung sounds are clear bilaterally and is satting well on room air without any shortness of breath making pneumonia unlikely  Additional history obtained: N/A Records reviewed N/A  ED Course and Reassessment: Patient did have uvula swelling on exam, concerning for uveitis as a cause of her sore throat.  This is likely viral in etiology.  She will be given Decadron to help with her pain and swelling as well as Motrin.  She was  recommended strict primary care follow-up and was given return precautions.  Independent labs interpretation:  The following labs were independently interpreted: Strep negative, COVID and flu negative  Independent visualization of imaging: N/A  Consultation: - Consulted or discussed management/test interpretation w/ external professional: N/A  Consideration for admission or further workup: Patient has no evidence of deep space infection or airway compromise requiring admission and was stable for discharge with outpatient primary care follow-up Social Determinants of health:  N/A    Risk OTC drugs.           Final Clinical Impression(s) / ED Diagnoses Final diagnoses:  None    Rx / DC Orders ED Discharge Orders     None         Ottie Glazier, DO 12/12/21 1818

## 2022-02-13 ENCOUNTER — Telehealth (HOSPITAL_BASED_OUTPATIENT_CLINIC_OR_DEPARTMENT_OTHER): Payer: Self-pay | Admitting: Nurse Practitioner

## 2022-02-13 NOTE — Telephone Encounter (Signed)
Rec'd voicemail from Patient-   Called and LVM for the patient to call back if needed

## 2022-02-14 ENCOUNTER — Other Ambulatory Visit (HOSPITAL_BASED_OUTPATIENT_CLINIC_OR_DEPARTMENT_OTHER): Payer: Self-pay | Admitting: Nurse Practitioner

## 2022-02-14 ENCOUNTER — Ambulatory Visit (INDEPENDENT_AMBULATORY_CARE_PROVIDER_SITE_OTHER): Payer: 59 | Admitting: Nurse Practitioner

## 2022-02-14 ENCOUNTER — Encounter (HOSPITAL_BASED_OUTPATIENT_CLINIC_OR_DEPARTMENT_OTHER): Payer: Self-pay | Admitting: Nurse Practitioner

## 2022-02-14 VITALS — BP 124/65 | HR 76 | Ht 65.0 in | Wt 152.0 lb

## 2022-02-14 DIAGNOSIS — Z113 Encounter for screening for infections with a predominantly sexual mode of transmission: Secondary | ICD-10-CM

## 2022-02-14 DIAGNOSIS — F339 Major depressive disorder, recurrent, unspecified: Secondary | ICD-10-CM | POA: Diagnosis not present

## 2022-02-14 DIAGNOSIS — N951 Menopausal and female climacteric states: Secondary | ICD-10-CM | POA: Diagnosis not present

## 2022-02-14 DIAGNOSIS — L409 Psoriasis, unspecified: Secondary | ICD-10-CM | POA: Diagnosis not present

## 2022-02-14 DIAGNOSIS — B009 Herpesviral infection, unspecified: Secondary | ICD-10-CM

## 2022-02-14 DIAGNOSIS — L280 Lichen simplex chronicus: Secondary | ICD-10-CM

## 2022-02-14 MED ORDER — BETAMETHASONE DIPROPIONATE 0.05 % EX OINT: TOPICAL_OINTMENT | Freq: Two times a day (BID) | CUTANEOUS | 3 refills | Status: DC

## 2022-02-14 MED ORDER — VENLAFAXINE HCL ER 37.5 MG PO CP24: ORAL_CAPSULE | ORAL | 0 refills | Status: DC

## 2022-02-14 MED ORDER — VENLAFAXINE HCL ER 75 MG PO CP24: 75.0000 mg | ORAL_CAPSULE | Freq: Every day | ORAL | 1 refills | Status: DC

## 2022-02-14 MED ORDER — TAZAROTENE 0.1 % EX CREA: TOPICAL_CREAM | Freq: Every day | CUTANEOUS | 3 refills | Status: DC

## 2022-02-14 MED ORDER — VALACYCLOVIR HCL 1 G PO TABS: 1000.0000 mg | ORAL_TABLET | Freq: Three times a day (TID) | ORAL | 3 refills | Status: DC

## 2022-02-14 NOTE — Assessment & Plan Note (Signed)
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 Recommend valacyclovir for treatment at this time. Will screen for HSV with laboratory testing.

## 2022-02-14 NOTE — Assessment & Plan Note (Signed)
Self swab obtained today with blood testing also completed. No alarm symptoms reported. Will make changes to plan of care based on results.

## 2022-02-14 NOTE — Assessment & Plan Note (Signed)
Lichenification noted to left elbow over psoriatic lesion and the surrounding tissue. Treatment with high dose topical corticosteroid recommended.
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
## 2022-02-14 NOTE — Assessment & Plan Note (Signed)
Ongoing depressive symptoms not controlled with citalopram. Given menopausal vasomotor symptoms, we will trial venlafaxine for control of both. Patient instructed on use. Will follow-up in 4 weeks.

## 2022-02-14 NOTE — Assessment & Plan Note (Signed)
Chronic hot flashes related to Christina Cunningham onset menopause. Discussion of medications that may be helpful. Joint decision to trial venlafaxine to see if this is helpful for symptom management.

## 2022-02-14 NOTE — Patient Instructions (Addendum)
I have sent in a medication called venlafaxine- this is for mood and for menopausal symptoms.  STOP the citalopram and start this.  You will take the small dose first THEN start the larger dose.  I have sent in 2 creams called betamethasone and tazarotene. You can use these together at bedtime every day on the psoriasis spots and the betamethasone you can use alone in the mornings to help with itching. If this does not start to look better after using for 10 days, let me know.   I will let you know about your tests.    GOOD LUCK ON YOUR TEST!!!!!
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
## 2022-02-15 LAB — HSV 1 AND 2 AB, IGG
HSV 1 Glycoprotein G Ab, IgG: 16.1 index — ABNORMAL HIGH (ref 0.00–0.90)
HSV 2 IgG, Type Spec: 10.8 index — ABNORMAL HIGH (ref 0.00–0.90)

## 2022-02-15 LAB — HEPATITIS B SURFACE ANTIGEN: Hepatitis B Surface Ag: NEGATIVE

## 2022-02-15 LAB — RPR: RPR Ser Ql: NONREACTIVE

## 2022-02-15 LAB — HEPATITIS C ANTIBODY: Hep C Virus Ab: NONREACTIVE

## 2022-02-15 LAB — HIV ANTIBODY (ROUTINE TESTING W REFLEX): HIV Screen 4th Generation wRfx: NONREACTIVE

## 2022-02-18 LAB — CT NG TV HSV BY NAA
Chlamydia by NAA: NEGATIVE
Gonococcus by NAA: NEGATIVE
HSV 1 NAA: NEGATIVE
HSV 2 NAA: NEGATIVE
Trich vag by NAA: NEGATIVE

## 2022-02-26 ENCOUNTER — Other Ambulatory Visit (HOSPITAL_BASED_OUTPATIENT_CLINIC_OR_DEPARTMENT_OTHER): Payer: Self-pay

## 2022-02-26 MED ORDER — INFLUENZA VAC SPLIT QUAD 0.5 ML IM SUSY
PREFILLED_SYRINGE | INTRAMUSCULAR | 0 refills | Status: DC
  Filled 2022-02-26: qty 0.5, 1d supply, fill #0

## 2022-03-07 ENCOUNTER — Encounter (HOSPITAL_BASED_OUTPATIENT_CLINIC_OR_DEPARTMENT_OTHER): Payer: Self-pay

## 2022-03-07 DIAGNOSIS — S301XXA Contusion of abdominal wall, initial encounter: Secondary | ICD-10-CM | POA: Diagnosis not present

## 2022-03-07 DIAGNOSIS — Y92410 Unspecified street and highway as the place of occurrence of the external cause: Secondary | ICD-10-CM | POA: Insufficient documentation

## 2022-03-07 DIAGNOSIS — M546 Pain in thoracic spine: Secondary | ICD-10-CM | POA: Insufficient documentation

## 2022-03-07 DIAGNOSIS — R102 Pelvic and perineal pain: Secondary | ICD-10-CM | POA: Diagnosis not present

## 2022-03-07 DIAGNOSIS — M545 Low back pain, unspecified: Secondary | ICD-10-CM | POA: Diagnosis not present

## 2022-03-07 DIAGNOSIS — R0789 Other chest pain: Secondary | ICD-10-CM | POA: Insufficient documentation

## 2022-03-07 DIAGNOSIS — M25572 Pain in left ankle and joints of left foot: Secondary | ICD-10-CM | POA: Diagnosis not present

## 2022-03-07 DIAGNOSIS — S8012XA Contusion of left lower leg, initial encounter: Secondary | ICD-10-CM | POA: Diagnosis not present

## 2022-03-07 DIAGNOSIS — M25552 Pain in left hip: Secondary | ICD-10-CM | POA: Diagnosis not present

## 2022-03-07 DIAGNOSIS — S3991XA Unspecified injury of abdomen, initial encounter: Secondary | ICD-10-CM | POA: Diagnosis present

## 2022-03-07 DIAGNOSIS — M542 Cervicalgia: Secondary | ICD-10-CM | POA: Insufficient documentation

## 2022-03-07 DIAGNOSIS — M25562 Pain in left knee: Secondary | ICD-10-CM | POA: Insufficient documentation
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
## 2022-03-08 ENCOUNTER — Emergency Department (HOSPITAL_BASED_OUTPATIENT_CLINIC_OR_DEPARTMENT_OTHER)
Admission: EM | Admit: 2022-03-08 | Discharge: 2022-03-08 | Disposition: A | Discharge status: Home / Self Care | Payer: 59 | Attending: Emergency Medicine | Admitting: Emergency Medicine

## 2022-03-08 ENCOUNTER — Emergency Department (HOSPITAL_BASED_OUTPATIENT_CLINIC_OR_DEPARTMENT_OTHER): Payer: 59

## 2022-03-08 ENCOUNTER — Emergency Department (HOSPITAL_BASED_OUTPATIENT_CLINIC_OR_DEPARTMENT_OTHER): Payer: 59 | Admitting: Radiology

## 2022-03-08 DIAGNOSIS — Z041 Encounter for examination and observation following transport accident: Secondary | ICD-10-CM | POA: Diagnosis not present

## 2022-03-08 DIAGNOSIS — M25562 Pain in left knee: Secondary | ICD-10-CM | POA: Diagnosis not present

## 2022-03-08 DIAGNOSIS — M25552 Pain in left hip: Secondary | ICD-10-CM | POA: Diagnosis not present

## 2022-03-08 DIAGNOSIS — S8012XA Contusion of left lower leg, initial encounter: Secondary | ICD-10-CM | POA: Diagnosis not present

## 2022-03-08 DIAGNOSIS — M546 Pain in thoracic spine: Secondary | ICD-10-CM | POA: Diagnosis not present

## 2022-03-08 DIAGNOSIS — J9811 Atelectasis: Secondary | ICD-10-CM | POA: Diagnosis not present

## 2022-03-08 DIAGNOSIS — R102 Pelvic and perineal pain: Secondary | ICD-10-CM | POA: Diagnosis not present

## 2022-03-08 DIAGNOSIS — M545 Low back pain, unspecified: Secondary | ICD-10-CM | POA: Diagnosis not present

## 2022-03-08 DIAGNOSIS — S199XXA Unspecified injury of neck, initial encounter: Secondary | ICD-10-CM | POA: Diagnosis not present

## 2022-03-08 DIAGNOSIS — R0789 Other chest pain: Secondary | ICD-10-CM | POA: Diagnosis not present

## 2022-03-08 DIAGNOSIS — S301XXA Contusion of abdominal wall, initial encounter: Secondary | ICD-10-CM | POA: Diagnosis not present

## 2022-03-08 DIAGNOSIS — M25572 Pain in left ankle and joints of left foot: Secondary | ICD-10-CM | POA: Diagnosis not present

## 2022-03-08 LAB — CBC WITH DIFFERENTIAL/PLATELET
Abs Immature Granulocytes: 0 10*3/uL (ref 0.00–0.07)
Basophils Absolute: 0 10*3/uL (ref 0.0–0.1)
Basophils Relative: 0 %
Eosinophils Absolute: 0.1 10*3/uL (ref 0.0–0.5)
Eosinophils Relative: 2 %
HCT: 46.2 % — ABNORMAL HIGH (ref 36.0–46.0)
Hemoglobin: 15 g/dL (ref 12.0–15.0)
Immature Granulocytes: 0 %
Lymphocytes Relative: 40 %
Lymphs Abs: 2 10*3/uL (ref 0.7–4.0)
MCH: 28.2 pg (ref 26.0–34.0)
MCHC: 32.5 g/dL (ref 30.0–36.0)
MCV: 86.8 fL (ref 80.0–100.0)
Monocytes Absolute: 0.4 10*3/uL (ref 0.1–1.0)
Monocytes Relative: 8 %
Neutro Abs: 2.5 10*3/uL (ref 1.7–7.7)
Neutrophils Relative %: 50 %
Platelets: 184 10*3/uL (ref 150–400)
RBC: 5.32 MIL/uL — ABNORMAL HIGH (ref 3.87–5.11)
RDW: 15.1 % (ref 11.5–15.5)
WBC: 5.1 10*3/uL (ref 4.0–10.5)
nRBC: 0 % (ref 0.0–0.2)

## 2022-03-08 LAB — COMPREHENSIVE METABOLIC PANEL
ALT: 33 U/L (ref 0–44)
AST: 33 U/L (ref 15–41)
Albumin: 4.1 g/dL (ref 3.5–5.0)
Alkaline Phosphatase: 124 U/L (ref 38–126)
Anion gap: 9 (ref 5–15)
BUN: 7 mg/dL (ref 6–20)
CO2: 26 mmol/L (ref 22–32)
Calcium: 9.7 mg/dL (ref 8.9–10.3)
Chloride: 105 mmol/L (ref 98–111)
Creatinine, Ser: 0.59 mg/dL (ref 0.44–1.00)
GFR, Estimated: >60 mL/min (ref >60)
Glucose, Bld: 102 mg/dL — ABNORMAL HIGH (ref 70–99)
Potassium: 3.7 mmol/L (ref 3.5–5.1)
Sodium: 140 mmol/L (ref 135–145)
Total Bilirubin: 0.5 mg/dL (ref 0.3–1.2)
Total Protein: 7.6 g/dL (ref 6.5–8.1)

## 2022-03-08 LAB — HCG, SERUM, QUALITATIVE: Preg, Serum: NEGATIVE

## 2022-03-08 LAB — LIPASE, BLOOD: Lipase: 29 U/L (ref 11–51)

## 2022-03-08 MED ORDER — KETOROLAC TROMETHAMINE 30 MG/ML IJ SOLN
30.0000 mg | Freq: Once | INTRAMUSCULAR | Status: AC
  Administered 2022-03-08: 30 mg via INTRAVENOUS
  Filled 2022-03-08: qty 1

## 2022-03-08 NOTE — Discharge Instructions (Signed)
Apply ice to areas that are painful.  Ice to be applied for 30 minutes at a time, 4 times a day.  Ibuprofen or naproxen as needed for pain.  If you need additional pain relief, add acetaminophen.  When you combine acetaminophen with either ibuprofen or naproxen, you get better pain relief and you get from taking either medication by itself.
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
## 2022-03-14 ENCOUNTER — Other Ambulatory Visit (HOSPITAL_BASED_OUTPATIENT_CLINIC_OR_DEPARTMENT_OTHER): Payer: Self-pay | Admitting: Nurse Practitioner

## 2022-03-14 DIAGNOSIS — N3090 Cystitis, unspecified without hematuria: Secondary | ICD-10-CM

## 2022-03-14 MED ORDER — FLUCONAZOLE 150 MG PO TABS: ORAL_TABLET | ORAL | 2 refills | Status: DC

## 2022-03-14 MED ORDER — SULFAMETHOXAZOLE-TRIMETHOPRIM 800-160 MG PO TABS: 1.0000 | ORAL_TABLET | Freq: Two times a day (BID) | ORAL | 0 refills | Status: DC

## 2022-05-01 ENCOUNTER — Other Ambulatory Visit (HOSPITAL_BASED_OUTPATIENT_CLINIC_OR_DEPARTMENT_OTHER): Payer: Self-pay | Admitting: Nurse Practitioner

## 2022-05-01 ENCOUNTER — Other Ambulatory Visit (HOSPITAL_BASED_OUTPATIENT_CLINIC_OR_DEPARTMENT_OTHER): Payer: Self-pay

## 2022-05-01 DIAGNOSIS — J4520 Mild intermittent asthma, uncomplicated: Secondary | ICD-10-CM

## 2022-05-02 ENCOUNTER — Other Ambulatory Visit (HOSPITAL_BASED_OUTPATIENT_CLINIC_OR_DEPARTMENT_OTHER): Payer: Self-pay

## 2022-05-02 MED ORDER — ALBUTEROL SULFATE HFA 108 (90 BASE) MCG/ACT IN AERS
2.0000 | INHALATION_SPRAY | Freq: Four times a day (QID) | RESPIRATORY_TRACT | 6 refills | Status: DC | PRN
  Filled 2022-05-02: qty 6.7, 17d supply, fill #0

## 2022-05-25 ENCOUNTER — Ambulatory Visit (INDEPENDENT_AMBULATORY_CARE_PROVIDER_SITE_OTHER): Payer: Commercial Managed Care - PPO | Admitting: Nurse Practitioner

## 2022-05-25 ENCOUNTER — Encounter: Payer: Self-pay | Admitting: Nurse Practitioner

## 2022-05-25 VITALS — BP 124/80 | HR 88 | Wt 149.8 lb

## 2022-05-25 DIAGNOSIS — Z792 Long term (current) use of antibiotics: Secondary | ICD-10-CM

## 2022-05-25 DIAGNOSIS — N760 Acute vaginitis: Secondary | ICD-10-CM | POA: Diagnosis not present

## 2022-05-25 DIAGNOSIS — Z113 Encounter for screening for infections with a predominantly sexual mode of transmission: Secondary | ICD-10-CM

## 2022-05-25 DIAGNOSIS — B009 Herpesviral infection, unspecified: Secondary | ICD-10-CM | POA: Diagnosis not present

## 2022-05-25 DIAGNOSIS — B9689 Other specified bacterial agents as the cause of diseases classified elsewhere: Secondary | ICD-10-CM | POA: Diagnosis not present

## 2022-05-25 MED ORDER — VALACYCLOVIR HCL 500 MG PO TABS
500.0000 mg | ORAL_TABLET | Freq: Every day | ORAL | 3 refills | Status: AC
  Filled 2022-05-30: qty 90, 90d supply, fill #0

## 2022-05-25 MED ORDER — VALACYCLOVIR HCL 1 G PO TABS
1000.0000 mg | ORAL_TABLET | Freq: Two times a day (BID) | ORAL | 0 refills | Status: AC
  Filled 2022-05-30: qty 20, 10d supply, fill #0

## 2022-05-25 MED ORDER — DOXYCYCLINE HYCLATE 100 MG PO TABS: 100.0000 mg | ORAL_TABLET | Freq: Two times a day (BID) | ORAL | 0 refills | Status: DC

## 2022-05-25 NOTE — Patient Instructions (Addendum)
I have sent in the referral for the counseling.   Look up this website: [email or web address]
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
## 2022-05-26 LAB — RPR: RPR Ser Ql: NONREACTIVE

## 2022-05-26 LAB — HIV ANTIBODY (ROUTINE TESTING W REFLEX): HIV Screen 4th Generation wRfx: NONREACTIVE

## 2022-05-26 LAB — HEPATITIS C ANTIBODY: Hep C Virus Ab: NONREACTIVE

## 2022-05-28 ENCOUNTER — Other Ambulatory Visit (HOSPITAL_BASED_OUTPATIENT_CLINIC_OR_DEPARTMENT_OTHER): Payer: Self-pay

## 2022-05-28 LAB — NUSWAB VAGINITIS PLUS (VG+)
Atopobium vaginae: HIGH Score — AB
BVAB 2: HIGH Score — AB
Candida albicans, NAA: NEGATIVE
Candida glabrata, NAA: NEGATIVE
Chlamydia trachomatis, NAA: NEGATIVE
Megasphaera 1: HIGH Score — AB
Neisseria gonorrhoeae, NAA: NEGATIVE
Trich vag by NAA: NEGATIVE

## 2022-05-29 ENCOUNTER — Other Ambulatory Visit (HOSPITAL_BASED_OUTPATIENT_CLINIC_OR_DEPARTMENT_OTHER): Payer: Self-pay

## 2022-05-30 ENCOUNTER — Other Ambulatory Visit (HOSPITAL_BASED_OUTPATIENT_CLINIC_OR_DEPARTMENT_OTHER): Payer: Self-pay

## 2022-05-30 MED ORDER — METRONIDAZOLE 0.75 % VA GEL
1.0000 | Freq: Every day | VAGINAL | 0 refills | Status: DC
  Filled 2022-05-30 – 2022-08-16 (×2): qty 70, 7d supply, fill #0

## 2022-05-31 ENCOUNTER — Other Ambulatory Visit (HOSPITAL_BASED_OUTPATIENT_CLINIC_OR_DEPARTMENT_OTHER): Payer: Self-pay
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
## 2022-06-11 ENCOUNTER — Other Ambulatory Visit (HOSPITAL_BASED_OUTPATIENT_CLINIC_OR_DEPARTMENT_OTHER): Payer: Self-pay

## 2022-06-21 DIAGNOSIS — F321 Major depressive disorder, single episode, moderate: Secondary | ICD-10-CM | POA: Diagnosis not present

## 2022-07-05 DIAGNOSIS — F321 Major depressive disorder, single episode, moderate: Secondary | ICD-10-CM | POA: Diagnosis not present

## 2022-08-16 ENCOUNTER — Other Ambulatory Visit: Payer: Self-pay | Admitting: Nurse Practitioner

## 2022-08-16 ENCOUNTER — Other Ambulatory Visit (HOSPITAL_BASED_OUTPATIENT_CLINIC_OR_DEPARTMENT_OTHER): Payer: Self-pay

## 2022-08-16 DIAGNOSIS — F321 Major depressive disorder, single episode, moderate: Secondary | ICD-10-CM | POA: Diagnosis not present

## 2022-08-16 DIAGNOSIS — Z113 Encounter for screening for infections with a predominantly sexual mode of transmission: Secondary | ICD-10-CM

## 2022-08-16 DIAGNOSIS — Z792 Long term (current) use of antibiotics: Secondary | ICD-10-CM

## 2022-08-17 ENCOUNTER — Other Ambulatory Visit (HOSPITAL_BASED_OUTPATIENT_CLINIC_OR_DEPARTMENT_OTHER): Payer: Self-pay

## 2022-09-27 ENCOUNTER — Ambulatory Visit (INDEPENDENT_AMBULATORY_CARE_PROVIDER_SITE_OTHER): Payer: Commercial Managed Care - PPO | Admitting: Dermatology

## 2022-09-27 ENCOUNTER — Other Ambulatory Visit (HOSPITAL_BASED_OUTPATIENT_CLINIC_OR_DEPARTMENT_OTHER): Payer: Self-pay

## 2022-09-27 ENCOUNTER — Encounter: Payer: Self-pay | Admitting: Dermatology

## 2022-09-27 VITALS — BP 123/81

## 2022-09-27 DIAGNOSIS — L409 Psoriasis, unspecified: Secondary | ICD-10-CM | POA: Diagnosis not present

## 2022-09-27 MED ORDER — SKYRIZI PEN 150 MG/ML ~~LOC~~ SOAJ: 150.0000 mg | SUBCUTANEOUS | 4 refills | Status: DC

## 2022-09-27 MED ORDER — SKYRIZI PEN 150 MG/ML ~~LOC~~ SOAJ: 150.0000 mg | SUBCUTANEOUS | 1 refills | Status: DC

## 2022-09-27 MED ORDER — CLOBETASOL PROPIONATE 0.05 % EX CREA
1.0000 | TOPICAL_CREAM | Freq: Two times a day (BID) | CUTANEOUS | 1 refills | Status: DC
  Filled 2022-09-27: qty 60, 14d supply, fill #0

## 2022-09-27 NOTE — Progress Notes (Signed)
   New Patient Visit   Subjective  Christina Cunningham is a 42 y.o. female who presents for the following: Psoriasis lesions on elbows and the right leg, maybe the right buttock. She has had this for about 20 years. She is currently using antibiotic ointment because she ran out of Triamcinolone cream, which she prefers. Past med was  Diprolene 0.05% ointment.She says it burned. It is a 10 on the itch scale.   She has had ILK injections 7-8 years ago. She tried [place] for 1 year which caused diarrhea.  The following portions of the chart were reviewed this encounter and updated as appropriate: medications, allergies, medical history  Review of Systems:  No other skin or systemic complaints except as noted in HPI or Assessment and Plan.  Objective  Well appearing patient in no apparent distress; mood and affect are within normal limits.  Areas Examined: Arms, right leg, right buttock  Relevant exam findings are noted in the Assessment and Plan.      Assessment & Plan     PSORIASIS Well-demarcated erythematous papules/plaques with silvery scale, guttate pink scaly papules.  Currently flared  Joint pain at elbows  Treatment Plan: Screen for Hepatitis and get baseline labs. Discussed precautions and possible side affects of using a biologic medication. Advised starting Skyrizi Clobetsol cream 2 x daily for 2 weeks then stop. Labs ordered: CBC, CMP, Hep panel, Quanitferon gold  Counseling on psoriasis and coordination of care  psoriasis is a chronic non-curable, but treatable genetic/hereditary disease that may have other systemic features affecting other organ systems such as joints (Psoriatic Arthritis). It is associated with an increased risk of inflammatory bowel disease, heart disease, non-alcoholic fatty liver disease, and depression.  Treatments include light and laser treatments; topical medications; and systemic medications including oral and injectables.    Return in  about 1 month (around 10/28/2022) for Psoriasis Follow UP.  Jaclynn Guarneri, CMA, am acting as scribe for [employer], DO.   Documentation: I have reviewed the above documentation for accuracy and completeness, and I agree with the above.  Langston Reusing, DO

## 2022-09-27 NOTE — Patient Instructions (Addendum)
Due to recent changes in healthcare laws, you may see results of your pathology and/or laboratory studies on MyChart before the doctors have had a chance to review them. We understand that in some cases there may be results that are confusing or concerning to you. Please understand that not all results are received at the same time and often the doctors may need to interpret multiple results in order to provide you with the best plan of care or course of treatment. Therefore, we ask that you please give us 2 business days to thoroughly review all your results before contacting the office for clarification. Should we see a critical lab result, you will be contacted sooner.   If You Need Anything After Your Visit  If you have any questions or concerns for your doctor, please call our main line at [phone] If no one answers, please leave a voicemail as directed and we will return your call as soon as possible. Messages left after 4 pm will be answered the following business day.   You may also send us a message via MyChart. We typically respond to MyChart messages within 1-2 business days.  For prescription refills, please ask your pharmacy to contact our office. Our fax number is [phone].  If you have an urgent issue when the clinic is closed that cannot wait until the next business day, you can page your doctor at the number below.    Please note that while we do our best to be available for urgent issues outside of office hours, we are not available 24/7.   If you have an urgent issue and are unable to reach us, you may choose to seek medical care at your doctor's office, retail clinic, urgent care center, or emergency room.  If you have a medical emergency, please immediately call 911 or go to the emergency department. In the event of inclement weather, please call our main line at [phone] for an update on the status of any delays or closures.  Dermatology Medication Tips: Please  keep the boxes that topical medications come in in order to help keep track of the instructions about where and how to use these. Pharmacies typically print the medication instructions only on the boxes and not directly on the medication tubes.   If your medication is too expensive, please contact our office at [phone] or send us a message through MyChart.   We are unable to tell what your co-pay for medications will be in advance as this is different depending on your insurance coverage. However, we may be able to find a substitute medication at lower cost or fill out paperwork to get insurance to cover a needed medication.   If a prior authorization is required to get your medication covered by your insurance company, please allow us 1-2 business days to complete this process.  Drug prices often vary depending on where the prescription is filled and some pharmacies may offer cheaper prices.  The website www.goodrx.com contains coupons for medications through different pharmacies. The prices here do not account for what the cost may be with help from insurance (it may be cheaper with your insurance), but the website can give you the price if you did not use any insurance.  - You can print the associated coupon and take it with your prescription to the pharmacy.  - You may also stop by our office during regular business hours and pick up a GoodRx coupon card.  - If you need your   prescription sent electronically to a different pharmacy, notify our office through [facility] MyChart or by phone at [phone]     

## 2022-10-02 LAB — QUANTIFERON-TB GOLD PLUS
QuantiFERON Mitogen Value: >10 IU/mL
QuantiFERON Nil Value: 0.02 IU/mL
QuantiFERON TB1 Ag Value: 0.05 IU/mL
QuantiFERON TB2 Ag Value: 0.04 IU/mL
QuantiFERON-TB Gold Plus: NEGATIVE

## 2022-10-02 LAB — CBC WITH DIFFERENTIAL/PLATELET
Basophils Absolute: 0 10*3/uL (ref 0.0–0.2)
Basos: 0 %
EOS (ABSOLUTE): 0.1 10*3/uL (ref 0.0–0.4)
Eos: 3 %
Hematocrit: 45.2 % (ref 34.0–46.6)
Hemoglobin: 14.8 g/dL (ref 11.1–15.9)
Immature Grans (Abs): 0 10*3/uL (ref 0.0–0.1)
Immature Granulocytes: 0 %
Lymphocytes Absolute: 1.4 10*3/uL (ref 0.7–3.1)
Lymphs: 34 %
MCH: 28.2 pg (ref 26.6–33.0)
MCHC: 32.7 g/dL (ref 31.5–35.7)
MCV: 86 fL (ref 79–97)
Monocytes Absolute: 0.4 10*3/uL (ref 0.1–0.9)
Monocytes: 10 %
Neutrophils Absolute: 2.2 10*3/uL (ref 1.4–7.0)
Neutrophils: 53 %
Platelets: 212 10*3/uL (ref 150–450)
RBC: 5.24 x10E6/uL (ref 3.77–5.28)
RDW: 12.8 % (ref 11.7–15.4)
WBC: 4.2 10*3/uL (ref 3.4–10.8)

## 2022-10-02 LAB — COMPREHENSIVE METABOLIC PANEL
ALT: 19 IU/L (ref 0–32)
AST: 24 IU/L (ref 0–40)
Albumin/Globulin Ratio: 1.4 (ref 1.2–2.2)
Albumin: 4.4 g/dL (ref 3.9–4.9)
Alkaline Phosphatase: 139 IU/L — ABNORMAL HIGH (ref 44–121)
BUN/Creatinine Ratio: 8 — ABNORMAL LOW (ref 9–23)
BUN: 6 mg/dL (ref 6–24)
Bilirubin Total: 0.6 mg/dL (ref 0.0–1.2)
CO2: 24 mmol/L (ref 20–29)
Calcium: 9.6 mg/dL (ref 8.7–10.2)
Chloride: 103 mmol/L (ref 96–106)
Creatinine, Ser: 0.74 mg/dL (ref 0.57–1.00)
Globulin, Total: 3.2 g/dL (ref 1.5–4.5)
Glucose: 115 mg/dL — ABNORMAL HIGH (ref 70–99)
Potassium: 3.8 mmol/L (ref 3.5–5.2)
Sodium: 143 mmol/L (ref 134–144)
Total Protein: 7.6 g/dL (ref 6.0–8.5)
eGFR: 103 mL/min/{1.73_m2} (ref >59)

## 2022-10-02 LAB — ACUTE HEP PANEL AND HEP B SURFACE AB
Hep A IgM: NEGATIVE
Hep B C IgM: NEGATIVE
Hep C Virus Ab: NONREACTIVE
Hepatitis B Surf Ab Quant: <3.5 m[IU]/mL — ABNORMAL LOW (ref >9.9)
Hepatitis B Surface Ag: NEGATIVE

## 2022-10-04 ENCOUNTER — Other Ambulatory Visit (HOSPITAL_COMMUNITY): Payer: Self-pay

## 2022-10-12 ENCOUNTER — Other Ambulatory Visit: Payer: Self-pay

## 2022-10-12 ENCOUNTER — Other Ambulatory Visit (HOSPITAL_COMMUNITY): Payer: Self-pay

## 2022-10-12 ENCOUNTER — Ambulatory Visit: Payer: Commercial Managed Care - PPO | Attending: Nurse Practitioner | Admitting: Pharmacist

## 2022-10-12 DIAGNOSIS — Z79899 Other long term (current) drug therapy: Secondary | ICD-10-CM

## 2022-10-12 DIAGNOSIS — L409 Psoriasis, unspecified: Secondary | ICD-10-CM

## 2022-10-12 MED ORDER — SKYRIZI PEN 150 MG/ML ~~LOC~~ SOAJ
150.0000 mg | SUBCUTANEOUS | 4 refills | Status: DC
  Filled 2022-10-12: qty 1, fill #0

## 2022-10-12 MED ORDER — SKYRIZI PEN 150 MG/ML ~~LOC~~ SOAJ
150.0000 mg | SUBCUTANEOUS | 1 refills | Status: DC
  Filled 2022-10-12: qty 1, fill #0
  Filled 2022-10-25: qty 1, 28d supply, fill #0

## 2022-10-12 NOTE — Progress Notes (Signed)
   S: Patient presents for review of their specialty medication therapy.  Patient is about to start taking Skyrizi for psoriasis. Patient is managed by Dr. Onalee Hua for this.   Adherence: has not started   Efficacy: has not started   Dosing: Plaque psoriasis, moderate to severe: SubQ: Two consecutive injections (75 mg each) for a total dose of 150 mg at weeks 0, 4, and then every 12 weeks thereafter.  Screening: TB test: completed   Monitoring: S/sx of infection: none S/sx of hypersensitivity/injection site reaction: none  O:   Lab Results  Component Value Date   WBC 4.2 09/27/2022   HGB 14.8 09/27/2022   HCT 45.2 09/27/2022   MCV 86 09/27/2022   PLT 212 09/27/2022      Chemistry      Component Value Date/Time   NA 143 09/27/2022 1025   K 3.8 09/27/2022 1025   CL 103 09/27/2022 1025   CO2 24 09/27/2022 1025   BUN 6 09/27/2022 1025   CREATININE 0.74 09/27/2022 1025      Component Value Date/Time   CALCIUM 9.6 09/27/2022 1025   ALKPHOS 139 (H) 09/27/2022 1025   AST 24 09/27/2022 1025   ALT 19 09/27/2022 1025   BILITOT 0.6 09/27/2022 1025      A/P: 1. Medication review: Patient is about to start on Skyrizi for psoriasis. Reviewed the medication with the patient, including the following: Cristy Folks is a monoclonal antibody used in the treatment of psoriasis. Patient educated on purpose, proper use and potential adverse effects of Skyrizi. Possible adverse effects are infections, headache, and injection site reactions. Live vaccinations should be avoided while on therapy. SubQ: Administer the two consecutive injections subcutaneously at different anatomic locations, such as thighs, abdomen, or back of upper arms. Do not inject into areas where the skin is tender, bruised, red, hard, or affected by psoriasis. Intended for use under supervision of a health care professional; self-injection may occur after proper training (except back of upper arms). No recommendations for any  changes at this time.  Butch Penny, PharmD, Patsy [place], CPP Clinical Pharmacist [facility] [phone]

## 2022-10-15 ENCOUNTER — Telehealth: Payer: Self-pay

## 2022-10-15 NOTE — Telephone Encounter (Signed)
LVM with pt asking if she rec'vd notification stating she will have to have Skyrizi with [facility]. My Chart Msg sent as well.

## 2022-10-16 ENCOUNTER — Other Ambulatory Visit (HOSPITAL_COMMUNITY): Payer: Self-pay

## 2022-10-19 ENCOUNTER — Other Ambulatory Visit (HOSPITAL_COMMUNITY): Payer: Self-pay

## 2022-10-19 ENCOUNTER — Encounter (HOSPITAL_COMMUNITY): Payer: Self-pay

## 2022-10-22 ENCOUNTER — Other Ambulatory Visit (HOSPITAL_COMMUNITY): Payer: Self-pay

## 2022-10-25 ENCOUNTER — Other Ambulatory Visit: Payer: Self-pay

## 2022-10-25 ENCOUNTER — Encounter: Payer: Self-pay | Admitting: Dermatology

## 2022-10-25 ENCOUNTER — Other Ambulatory Visit (HOSPITAL_COMMUNITY): Payer: Self-pay

## 2022-10-25 NOTE — Telephone Encounter (Signed)
Please call patient to schedule follow up appointment

## 2022-10-30 ENCOUNTER — Other Ambulatory Visit: Payer: Self-pay

## 2022-11-06 ENCOUNTER — Telehealth: Payer: Self-pay

## 2022-11-06 NOTE — Telephone Encounter (Signed)
Her phone messages were passed around for 12 days.I spoke to her and scheduled a visit for her to get the 1st Skyrizi injection.

## 2022-11-12 ENCOUNTER — Encounter: Payer: Self-pay | Admitting: Dermatology

## 2022-11-12 ENCOUNTER — Other Ambulatory Visit: Payer: Self-pay | Admitting: Pharmacist

## 2022-11-12 ENCOUNTER — Ambulatory Visit (INDEPENDENT_AMBULATORY_CARE_PROVIDER_SITE_OTHER): Payer: Commercial Managed Care - PPO | Admitting: Dermatology

## 2022-11-12 ENCOUNTER — Other Ambulatory Visit (HOSPITAL_COMMUNITY): Payer: Self-pay

## 2022-11-12 ENCOUNTER — Other Ambulatory Visit: Payer: Self-pay

## 2022-11-12 DIAGNOSIS — L409 Psoriasis, unspecified: Secondary | ICD-10-CM

## 2022-11-12 MED ORDER — SKYRIZI PEN 150 MG/ML ~~LOC~~ SOAJ
150.0000 mg | SUBCUTANEOUS | 12 refills | Status: DC
  Filled 2022-11-12: qty 1, 28d supply, fill #0

## 2022-11-12 MED ORDER — SKYRIZI PEN 150 MG/ML ~~LOC~~ SOAJ
150.0000 mg | SUBCUTANEOUS | 0 refills | Status: DC
  Filled 2022-11-12: qty 1, 28d supply, fill #0

## 2022-11-12 MED ORDER — SKYRIZI PEN 150 MG/ML ~~LOC~~ SOAJ
150.0000 mg | SUBCUTANEOUS | 0 refills | Status: DC
  Filled 2022-11-12: qty 1, 28d supply, fill #0
  Filled 2022-11-19 – 2022-12-05 (×2): qty 1, 84d supply, fill #0

## 2022-11-12 MED ORDER — RISANKIZUMAB-RZAA 150 MG/ML ~~LOC~~ SOAJ
150.0000 mg | Freq: Once | SUBCUTANEOUS | Status: AC
  Administered 2022-11-12: 150 mg via SUBCUTANEOUS

## 2022-11-12 MED ORDER — SKYRIZI PEN 150 MG/ML ~~LOC~~ SOAJ
150.0000 mg | SUBCUTANEOUS | 12 refills | Status: DC
  Filled 2022-11-12: qty 1, 28d supply, fill #0
  Filled 2023-03-21: qty 1, 84d supply, fill #0
  Filled 2023-06-05: qty 1, 84d supply, fill #1

## 2022-11-12 MED ORDER — CALCIPOTRIENE 0.005 % EX CREA: TOPICAL_CREAM | Freq: Two times a day (BID) | CUTANEOUS | 3 refills | Status: AC

## 2022-11-12 NOTE — Patient Instructions (Addendum)
- Skyrizi injected subcutaneous Left Thigh today, Patient tolerated well - Advised to Stop Clobetasol Ointment - We will plan to prescribe Calcipotriene Cream to apply 2 times daily until next visit - We will plan to follow up In 4 weeks for next Skyrizi Injection  Due to recent changes in healthcare laws, you may see results of your pathology and/or laboratory studies on MyChart before the doctors have had a chance to review them. We understand that in some cases there may be results that are confusing or concerning to you. Please understand that not all results are received at the same time and often the doctors may need to interpret multiple results in order to provide you with the best plan of care or course of treatment. Therefore, we ask that you please give [place] 2 business days to thoroughly review all your results before contacting the office for clarification. Should we see a critical lab result, you will be contacted sooner.   If You Need Anything After Your Visit  If you have any questions or concerns for your doctor, please call our main line at [phone] If no one answers, please leave a voicemail as directed and we will return your call as soon as possible. Messages left after 4 pm will be answered the following business day.   You may also send [place] a message via MyChart. We typically respond to MyChart messages within 1-2 business days.  For prescription refills, please ask your pharmacy to contact our office. Our fax number is [phone].  If you have an urgent issue when the clinic is closed that cannot wait until the next business day, you can page your doctor at the number below.    Please note that while we do our best to be available for urgent issues outside of office hours, we are not available 24/7.   If you have an urgent issue and are unable to reach [place], you may choose to seek medical care at your doctor's office, retail clinic, urgent care center, or emergency  room.  If you have a medical emergency, please immediately call 911 or go to the emergency department. In the event of inclement weather, please call our main line at [phone] for an update on the status of any delays or closures.  Dermatology Medication Tips: Please keep the boxes that topical medications come in in order to help keep track of the instructions about where and how to use these. Pharmacies typically print the medication instructions only on the boxes and not directly on the medication tubes.   If your medication is too expensive, please contact our office at [phone] or send [place] a message through MyChart.   We are unable to tell what your co-pay for medications will be in advance as this is different depending on your insurance coverage. However, we may be able to find a substitute medication at lower cost or fill out paperwork to get insurance to cover a needed medication.   If a prior authorization is required to get your medication covered by your insurance company, please allow [place] 1-2 business days to complete this process.  Drug prices often vary depending on where the prescription is filled and some pharmacies may offer cheaper prices.  The website www.goodrx.com contains coupons for medications through different pharmacies. The prices here do not account for what the cost may be with help from insurance (it may be cheaper with your insurance), but the website can give you the price if you did  not use any insurance.  - You can print the associated coupon and take it with your prescription to the pharmacy.  - You may also stop by our office during regular business hours and pick up a GoodRx coupon card.  - If you need your prescription sent electronically to a different pharmacy, notify our office through [facility] or by phone at [phone]

## 2022-11-12 NOTE — Progress Notes (Signed)
   Follow-Up Visit   Subjective  Christina Cunningham is a 42 y.o. female who presents for the following: Psoriasis  Patient present today for follow up visit for psoriasis. Patient was last evaluated on 09/27/22. Patient reports sxs are flared. Patient reports no medication changes. She has currently be using Clobetasol with minimal relief. Patient rates her itch 10 out of 10.   The following portions of the chart were reviewed this encounter and updated as appropriate: medications, allergies, medical history  Review of Systems:  No other skin or systemic complaints except as noted in HPI or Assessment and Plan.  Objective  Well appearing patient in no apparent distress; mood and affect are within normal limits.  A focused examination was performed of the following areas: Elbows and RLE  Relevant exam findings are noted in the Assessment and Plan.                 Assessment & Plan   PSORIASIS Exam: Well-demarcated erythematous papules/plaques with silvery scale, guttate pink scaly papules. 10% BSA, PGA3  Flared  Patient reports joint pain at Left Ring Finger Joint  Psoriasis is a chronic non-curable, but treatable genetic/hereditary disease that may have other systemic features affecting other organ systems such as joints (Psoriatic Arthritis). It is associated with an increased risk of inflammatory bowel disease, heart disease, non-alcoholic fatty liver disease, and depression.  Treatments include light and laser treatments; topical medications; and systemic medications including oral and injectables.  Treatment Plan: - Skyrizi injected subcutaneous Left Thigh today, Patient tolerated well - Advised to Stop Clobetasol Ointment - We will plan to prescribe Calcipotriene Cream to apply 2 times daily until next visit - We will plan to follow up In 4 weeks for next Skyrizi Injection  Psoriasis  Related Medications tazarotene (AVAGE) 0.1 % cream Apply topically at bedtime. For  Psoriasis  betamethasone dipropionate (DIPROLENE) 0.05 % ointment Apply topically 2 (two) times daily. Psoriasis  clobetasol cream (TEMOVATE) 0.05 % Apply 1 Application topically 2 (two) times daily. Apply to affected areas twice daily for 2 weeks then stop  Risankizumab-rzaa (SKYRIZI PEN) 150 MG/ML SOAJ Inject 150 mg Subcutaneously at week 4 then every 12 weeks as directed.  risankizumab-rzaa (SKYRIZI) pen 150 mg   calcipotriene (DOVONOX) 0.005 % cream Apply topically 2 (two) times daily.  risankizumab-rzaa (SKYRIZI PEN) 150 MG/ML pen Inject 150 mg Subcuteaneously at week 0 and week 4 as directed.    Return in about 4 weeks (around 12/10/2022) for Psoriasis F/U.  Documentation: I have reviewed the above documentation for accuracy and completeness, and I agree with the above.  Stasia Cavalier, am acting as scribe for Langston Reusing, DO.  Langston Reusing, DO

## 2022-11-19 ENCOUNTER — Other Ambulatory Visit (HOSPITAL_COMMUNITY): Payer: Self-pay

## 2022-11-19 ENCOUNTER — Other Ambulatory Visit: Payer: Self-pay

## 2022-11-26 ENCOUNTER — Other Ambulatory Visit: Payer: Self-pay

## 2022-11-27 ENCOUNTER — Encounter (HOSPITAL_COMMUNITY): Payer: Self-pay

## 2022-11-27 ENCOUNTER — Other Ambulatory Visit (HOSPITAL_COMMUNITY): Payer: Self-pay

## 2022-12-03 ENCOUNTER — Other Ambulatory Visit (HOSPITAL_COMMUNITY): Payer: Self-pay

## 2022-12-03 ENCOUNTER — Encounter (HOSPITAL_COMMUNITY): Payer: Self-pay

## 2022-12-05 ENCOUNTER — Other Ambulatory Visit (HOSPITAL_COMMUNITY): Payer: Self-pay

## 2022-12-05 ENCOUNTER — Other Ambulatory Visit: Payer: Self-pay

## 2022-12-06 ENCOUNTER — Other Ambulatory Visit (HOSPITAL_COMMUNITY): Payer: Self-pay

## 2022-12-06 ENCOUNTER — Other Ambulatory Visit: Payer: Self-pay

## 2022-12-07 ENCOUNTER — Other Ambulatory Visit (HOSPITAL_COMMUNITY): Payer: Self-pay

## 2022-12-07 ENCOUNTER — Other Ambulatory Visit: Payer: Self-pay

## 2022-12-13 ENCOUNTER — Ambulatory Visit: Payer: Commercial Managed Care - PPO | Admitting: Dermatology

## 2022-12-13 DIAGNOSIS — L409 Psoriasis, unspecified: Secondary | ICD-10-CM | POA: Diagnosis not present

## 2022-12-13 NOTE — Patient Instructions (Signed)

## 2022-12-13 NOTE — Progress Notes (Addendum)
   Follow-Up Visit   Subjective  Christina Cunningham is a 42 y.o. female who presents for the following: Psoriasis  Patient present today for follow up visit for Psoriasis. Patient was last evaluated on 11/12/22. Patient reports sx are Better. She says she's about 40% better since starting injection. Patient reports she has had flares since her previous visit. At her previous visit she was prescribed Calcipotriene  Cream to apply 2 times daily during flares which she has been using. She said it was uncomfortable to use at first bu she adapted to the cream and now it isn't painful to use.Patient reports she has had to use topical agents. Patient reports no medication changes.  The following portions of the chart were reviewed this encounter and updated as appropriate: medications, allergies, medical history  Review of Systems:  No other skin or systemic complaints except as noted in HPI or Assessment and Plan.  Objective  Well appearing patient in no apparent distress; mood and affect are within normal limits.  A focused examination was performed of the following areas: Arms and legs  Relevant exam findings are noted in the Assessment and Plan.  Exam: Well-demarcated erythematous papules/plaques with silvery scale, guttate pink scaly papules. 10% BSA.            Assessment & Plan   PSORIASIS Marked improvement to last visit  Chronic and persistent condition with duration or expected duration over one year. Condition is symptomatic/ bothersome to patient. Not currently at goal.  Patient has no joint pain  Psoriasis is a chronic non-curable, but treatable genetic/hereditary disease that may have other systemic features affecting other organ systems such as joints (Psoriatic Arthritis). It is associated with an increased risk of inflammatory bowel disease, heart disease, non-alcoholic fatty liver disease, and depression.  Treatments include light and laser treatments; topical  medications; and systemic medications including oral and injectables.  Treatment Plan: -Pt was approved for Skyrizi  - Skyrizi  injection given while in office today (Right Thigh) Patient Supplied,  NDC: 9925-7899-99  Lot: [id number] ExP: NOV 2025 - Patient advised to return in 3 months for reassess sx -Pt can continue topicals as needed until clear

## 2023-01-06 ENCOUNTER — Encounter: Payer: Self-pay | Admitting: Dermatology

## 2023-01-19 ENCOUNTER — Encounter (HOSPITAL_COMMUNITY): Payer: Self-pay

## 2023-02-15 ENCOUNTER — Other Ambulatory Visit (HOSPITAL_COMMUNITY): Payer: Self-pay

## 2023-02-18 ENCOUNTER — Other Ambulatory Visit: Payer: Self-pay

## 2023-02-20 ENCOUNTER — Other Ambulatory Visit: Payer: Self-pay

## 2023-03-14 ENCOUNTER — Ambulatory Visit: Payer: Commercial Managed Care - PPO | Admitting: Dermatology

## 2023-03-19 ENCOUNTER — Other Ambulatory Visit: Payer: Self-pay

## 2023-03-21 ENCOUNTER — Other Ambulatory Visit: Payer: Self-pay

## 2023-03-21 ENCOUNTER — Encounter: Payer: Self-pay | Admitting: Nurse Practitioner

## 2023-03-21 ENCOUNTER — Encounter (HOSPITAL_COMMUNITY): Payer: Self-pay

## 2023-03-21 NOTE — Progress Notes (Signed)
Specialty Pharmacy Ongoing Clinical Assessment Note  Christina Cunningham is a 42 y.o. female who is being followed by the specialty pharmacy service for RxSp Psoriasis   Patient's specialty medication(s) reviewed today: Risankizumab-Rzaa (Antipsoriatics)   Missed doses in the last 4 weeks: 0   Patient/Caregiver did not have any additional questions or concerns.   Therapeutic benefit summary: Patient is achieving benefit   Adverse events/side effects summary: No adverse events/side effects   Patient's therapy is appropriate to: Continue    Goals Addressed             This Visit's Progress    Minimize recurrence of flares       Patient is on track. Patient will maintain adherence         Follow up:  6 months  Bobette Mo Specialty Pharmacist

## 2023-03-21 NOTE — Progress Notes (Signed)
Specialty Pharmacy Refill Coordination Note  Christina Cunningham is a 42 y.o. female contacted today regarding refills of specialty medication(s) Risankizumab-Rzaa (Antipsoriatics)   Patient requested Courier to Provider Office   Delivery date: 03/25/23   Verified address: [facility] [address] (Updated Provider Address. Verified with patient.)   Medication will be filled on 03/22/23. Office will call patient once medication is received.

## 2023-03-26 ENCOUNTER — Other Ambulatory Visit (HOSPITAL_BASED_OUTPATIENT_CLINIC_OR_DEPARTMENT_OTHER): Payer: Self-pay

## 2023-03-26 ENCOUNTER — Encounter: Payer: Self-pay | Admitting: Nurse Practitioner

## 2023-03-26 ENCOUNTER — Ambulatory Visit (INDEPENDENT_AMBULATORY_CARE_PROVIDER_SITE_OTHER): Payer: Commercial Managed Care - PPO | Admitting: Nurse Practitioner

## 2023-03-26 VITALS — BP 130/82 | HR 68 | Wt 145.0 lb

## 2023-03-26 DIAGNOSIS — F339 Major depressive disorder, recurrent, unspecified: Secondary | ICD-10-CM

## 2023-03-26 DIAGNOSIS — N760 Acute vaginitis: Secondary | ICD-10-CM | POA: Diagnosis not present

## 2023-03-26 DIAGNOSIS — B9689 Other specified bacterial agents as the cause of diseases classified elsewhere: Secondary | ICD-10-CM | POA: Diagnosis not present

## 2023-03-26 MED ORDER — AZO BORIC ACID 600 MG VA SUPP
VAGINAL | 2 refills | Status: AC
  Filled 2023-03-26: qty 30, 21d supply, fill #0

## 2023-03-26 MED ORDER — METRONIDAZOLE 0.75 % VA GEL
1.0000 | Freq: Every day | VAGINAL | 6 refills | Status: AC
  Filled 2023-03-26: qty 70, 7d supply, fill #0

## 2023-03-26 MED ORDER — VENLAFAXINE HCL ER 75 MG PO CP24
75.0000 mg | ORAL_CAPSULE | Freq: Every day | ORAL | 3 refills | Status: AC
  Filled 2023-03-26: qty 90, 90d supply, fill #0

## 2023-03-26 MED ORDER — FLUCONAZOLE 150 MG PO TABS
ORAL_TABLET | ORAL | 2 refills | Status: AC
  Filled 2023-03-26: qty 2, 4d supply, fill #0

## 2023-03-26 NOTE — Progress Notes (Signed)
Tollie Eth, DNP, AGNP-c [facility] [address] [place], [place] [address] [phone]   ACUTE VISIT- ESTABLISHED PATIENT  Blood pressure 130/82, pulse 68, weight 145 lb (65.8 kg).  Subjective:  HPI Christina Cunningham is a 42 y.o. female presents to day for evaluation of acute concern(s).   History of Present Illness The patient, with a history of chronic vaginal discharge, presents with a recent exacerbation of symptoms. She reports a change in the nature of the discharge, which was initially white and has now turned a grayish color. The discharge is described as a mixture of thin and thick consistency. The patient denies any associated itching, burning, or irritation. She has been using metronidazole gel for symptom management, but reports no improvement. The patient has been using condoms for sexual activity and suspects a possible allergic reaction to latex as a contributing factor to her symptoms.   The patient also reports a recent significant weight loss, which she attributes to stress. She denies any other systemic symptoms.  She reports a recent arrest and subsequent stress, which she believes may have contributed to her current health status. 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   She has been on [employer], a medication for psoriasis, which she receives every three months.   The patient is also enrolled in school for esthetics and has plans to further her education in the medical field. She expresses a positive outlook on her future despite current challenges.  ROS negative except for what is listed in HPI. History, Medications, Surgery, SDOH, and Family History reviewed and updated as appropriate.  Objective:  Physical Exam Vitals and nursing note reviewed.  Constitutional:      Appearance: Normal appearance.  HENT:     Head: Normocephalic.  Eyes:     Pupils: Pupils are equal, round, and reactive to light.  Cardiovascular:      Rate and Rhythm: Normal rate and regular rhythm.     Pulses: Normal pulses.     Heart sounds: Normal heart sounds.  Pulmonary:     Effort: Pulmonary effort is normal.     Breath sounds: Normal breath sounds.  Genitourinary:    Comments: Patient self swab completed Musculoskeletal:        General: Normal range of motion.     Cervical back: Normal range of motion.  Skin:    General: Skin is warm.  Neurological:     General: No focal deficit present.     Mental Status: She is alert and oriented to person, place, and time.  Psychiatric:        Mood and Affect: Mood normal.          Assessment & Plan:   Problem List Items Addressed This Visit     Depression, recurrent (HCC)    Reports significant weight loss from 158 lbs to 145 lbs, likely due to stress and recent life events, including legal issues and family responsibilities. Discussed balanced diet and weight monitoring. Provided emotional support and stress management resources. - Encourage balanced diet and weight monitoring - Provide emotional support and stress management resources      Relevant Medications   venlafaxine XR (EFFEXOR XR) 75 MG 24 hr capsule   BV (bacterial vaginosis) - Primary    Presents with a mixture of thin and thick vaginal discharge, initially white then grayish, with typical BV odor. Symptoms for one week. Previous metronidazole gel treatment was ineffective. Differential includes bacterial vaginosis, condom irritation, or STI.  Discussed boric acid suppositories and fluconazole. Advised on potential latex condom irritation and non-latex alternatives. - Order vaginal swab to rule out other infections - Prescribe boric acid suppositories, one capsule intravaginally nightly for two weeks - Prescribe fluconazole (Diflucan) for oral administration - Advise use of non-latex condoms - Refill metronidazole gel with instructions to use with boric acid      Relevant Medications   fluconazole (DIFLUCAN) 150  MG tablet   Boric Acid Vaginal (AZO BORIC ACID) 600 MG SUPP   metroNIDAZOLE (METROGEL) 0.75 % vaginal gel   Other Relevant Orders   NuSwab VG Plus+Mycoplasmas,NAA      Tollie Eth, DNP, AGNP-c

## 2023-03-26 NOTE — Patient Instructions (Signed)
Boric Acid Vaginal Suppositories What is this medication? BORIC ACID (BOHR ik AS id) may support vaginal health. It may relieve the symptoms of a yeast infection, such as itching, burning, and odor. This medicine may be used for other purposes; ask your health care provider or pharmacist if you have questions. COMMON BRAND NAME(S): AZO Boric Acid with Aloe Vera, Hylafem What should I tell my care team before I take this medication? They need to know if you have any of these conditions: Diabetes Frequent infections HIV or AIDS Immune system problems An unusual or allergic reaction to boric acid, other medications, foods, dyes, or preservatives Pregnant or trying to get pregnant Breast-feeding How should I use this medication? This medication is for use in the vagina. Do not take by mouth. Follow the directions on the prescription label. Read package directions carefully before using. Wash hands before and after use. Use this medication at bedtime, unless otherwise directed by your care team. Do not use your medication more often than directed. Do not stop using this medication except on your care team's advice. Talk to your care team about the use of this medication in children. This medication is not approved for use in children. Overdosage: If you think you have taken too much of this medicine contact a poison control center or emergency room at once. NOTE: This medicine is only for you. Do not share this medicine with others. What if I miss a dose? If you miss a dose, use it as soon as you can. If it is almost time for your next dose, use only that dose. Do not use double or extra doses. What may interact with this medication? Interactions are not expected. Do not use any other vaginal products without telling your care team. This list may not describe all possible interactions. Give your health care provider a list of all the medicines, herbs, non-prescription drugs, or dietary supplements  you use. Also tell them if you smoke, drink alcohol, or use illegal drugs. Some items may interact with your medicine. What should I watch for while using this medication? Tell your care team if your symptoms do not start to get better within a few days. It is better not to have sex until you have finished your treatment. This medication may cause condoms, diaphragms, and spermicides to not work as well. Do not rely on any of these methods to prevent sexually transmitted infections (STIs) or pregnancy while you are using this medication. Vaginal medications may come out of the vagina during treatment. To keep the medication from getting on your clothing, wear a panty liner. The use of tampons is not recommended. To help clear up the infection, wear freshly washed cotton, not synthetic, underwear. What side effects may I notice from receiving this medication? Side effects that you should report to your care team as soon as possible: Allergic reactions--skin rash, itching, hives, swelling of the face, lips, tongue, or throat Unusual vaginal discharge, itching, or odor Side effects that usually do not require medical attention (report to your care team if they continue or are bothersome): Vaginal irritation at the application site This list may not describe all possible side effects. Call your doctor for medical advice about side effects. You may report side effects to FDA at 1-800-FDA-1088. Where should I keep my medication? Keep out of the reach of children and pets. Store in a cool, dry place between 15 and 30 degrees C (59 and 86 degrees F). Keep away from sunlight.  Throw away any unused medication after the expiration date. NOTE: This sheet is a summary. It may not cover all possible information. If you have questions about this medicine, talk to your doctor, pharmacist, or health care provider.  2024 Elsevier/Gold Standard (2021-04-03 00:00:00)

## 2023-03-26 NOTE — Assessment & Plan Note (Signed)
Presents with a mixture of thin and thick vaginal discharge, initially white then grayish, with typical BV odor. Symptoms for one week. Previous metronidazole gel treatment was ineffective. Differential includes bacterial vaginosis, condom irritation, or STI. Discussed boric acid suppositories and fluconazole. Advised on potential latex condom irritation and non-latex alternatives. - Order vaginal swab to rule out other infections - Prescribe boric acid suppositories, one capsule intravaginally nightly for two weeks - Prescribe fluconazole (Diflucan) for oral administration - Advise use of non-latex condoms - Refill metronidazole gel with instructions to use with boric acid

## 2023-03-26 NOTE — Assessment & Plan Note (Signed)
Reports significant weight loss from 158 lbs to 145 lbs, likely due to stress and recent life events, including legal issues and family responsibilities. Discussed balanced diet and weight monitoring. Provided emotional support and stress management resources. - Encourage balanced diet and weight monitoring - Provide emotional support and stress management resources

## 2023-03-30 LAB — NUSWAB VG PLUS+MYCOPLASMAS,NAA
Candida albicans, NAA: NEGATIVE
Candida glabrata, NAA: NEGATIVE
Mycoplasma genitalium NAA: NEGATIVE
Mycoplasma hominis NAA: POSITIVE — AB
Ureaplasma spp NAA: POSITIVE — AB

## 2023-04-05 ENCOUNTER — Other Ambulatory Visit (HOSPITAL_BASED_OUTPATIENT_CLINIC_OR_DEPARTMENT_OTHER): Payer: Self-pay

## 2023-04-05 ENCOUNTER — Other Ambulatory Visit: Payer: Self-pay | Admitting: Medical

## 2023-04-05 MED ORDER — MOXIFLOXACIN HCL 400 MG PO TABS
400.0000 mg | ORAL_TABLET | Freq: Every day | ORAL | 0 refills | Status: AC
  Filled 2023-04-05: qty 7, 7d supply, fill #0

## 2023-04-05 MED ORDER — DOXYCYCLINE HYCLATE 100 MG PO TABS
100.0000 mg | ORAL_TABLET | Freq: Two times a day (BID) | ORAL | 0 refills | Status: AC
  Filled 2023-04-05: qty 14, 7d supply, fill #0

## 2023-04-08 ENCOUNTER — Other Ambulatory Visit: Payer: Self-pay

## 2023-04-10 ENCOUNTER — Other Ambulatory Visit: Payer: Self-pay

## 2023-04-10 ENCOUNTER — Other Ambulatory Visit: Payer: Self-pay | Admitting: Nurse Practitioner

## 2023-05-28 ENCOUNTER — Encounter: Payer: Commercial Managed Care - PPO | Admitting: Nurse Practitioner

## 2023-06-04 ENCOUNTER — Other Ambulatory Visit (HOSPITAL_COMMUNITY): Payer: Self-pay

## 2023-06-05 ENCOUNTER — Other Ambulatory Visit: Payer: Self-pay

## 2023-06-05 NOTE — Progress Notes (Signed)
 Specialty Pharmacy Refill Coordination Note  Christina Cunningham is a 43 y.o. female contacted today regarding refills of specialty medication(s) Risankizumab -rzaa (Skyrizi  Pen)   Patient requested Delivery   Delivery date: 06/13/23   Verified address: [address]   Medication will be filled on 06/12/23.

## 2023-06-06 ENCOUNTER — Other Ambulatory Visit: Payer: Self-pay

## 2023-06-06 NOTE — Progress Notes (Signed)
 Pharmacy Patient Advocate Encounter   Received notification from Patient Pharmacy that prior authorization for Skyrizi  is required/requested.   Insurance verification completed.   The patient is insured through [facility] .   Per test claim: PA required; PA submitted to above mentioned insurance via CoverMyMeds Key/confirmation #/EOC [id number] Status is pending

## 2023-06-10 ENCOUNTER — Other Ambulatory Visit: Payer: Self-pay

## 2023-06-10 NOTE — Progress Notes (Signed)
 Pharmacy Patient Advocate Encounter  Received notification from [facility] that Prior Authorization for Skyrizi  has been APPROVED from 06/06/23 to 06/04/24  PA #/Case ID/Reference #: [id number]

## 2023-06-12 ENCOUNTER — Other Ambulatory Visit: Payer: Self-pay

## 2023-06-26 ENCOUNTER — Other Ambulatory Visit: Payer: Self-pay

## 2023-09-02 ENCOUNTER — Other Ambulatory Visit: Payer: Self-pay

## 2023-09-12 ENCOUNTER — Other Ambulatory Visit (HOSPITAL_COMMUNITY): Payer: Self-pay

## 2023-10-11 ENCOUNTER — Telehealth: Payer: Self-pay

## 2023-10-11 ENCOUNTER — Telehealth: Payer: Self-pay | Admitting: Pharmacist

## 2023-10-11 NOTE — Telephone Encounter (Signed)
 Call returned. Unfortunately, I continue to get her VM. Left HIPAA-compliant VM with instructions to return my call.

## 2023-10-11 NOTE — Telephone Encounter (Signed)
 Called patient to schedule an appointment for the [facility] [employer] [facility]. I was unable to reach the patient so I left a HIPAA-compliant message requesting that the patient return my call.   Butch Penny, PharmD, Patsy Baltimore, CPP Clinical Pharmacist [facility] [phone]

## 2023-10-11 NOTE — Telephone Encounter (Signed)
 Copied from CRM [id number]. Topic: General - Other >> Oct 11, 2023  4:05 PM Elle L wrote: Reason for CRM: The patient just missed a phone call from Freada Jacobs, Jonathon Neighbors, RPH-CPP. I reached out to the office who advised he was assisting other patients. The patients call back number is [phone].

## 2023-10-17 ENCOUNTER — Other Ambulatory Visit: Payer: Self-pay

## 2023-11-05 ENCOUNTER — Other Ambulatory Visit: Payer: Self-pay

## 2023-11-07 ENCOUNTER — Other Ambulatory Visit: Payer: Self-pay

## 2023-12-05 ENCOUNTER — Other Ambulatory Visit: Payer: Self-pay

## 2023-12-19 ENCOUNTER — Other Ambulatory Visit: Payer: Self-pay

## 2023-12-19 NOTE — Progress Notes (Signed)
 Disenrolling - no updated insurance populating in eligibility and pharmacy unsuccessful in reaching patient. Patient has not followed up and Skyrizi  last filled 2.12.25 (190 days ago).

## 2024-01-09 ENCOUNTER — Other Ambulatory Visit (HOSPITAL_BASED_OUTPATIENT_CLINIC_OR_DEPARTMENT_OTHER): Payer: Self-pay | Admitting: Nurse Practitioner

## 2024-01-09 ENCOUNTER — Other Ambulatory Visit: Payer: Self-pay | Admitting: Dermatology

## 2024-01-09 ENCOUNTER — Other Ambulatory Visit (HOSPITAL_BASED_OUTPATIENT_CLINIC_OR_DEPARTMENT_OTHER): Payer: Self-pay

## 2024-01-09 DIAGNOSIS — L409 Psoriasis, unspecified: Secondary | ICD-10-CM

## 2024-01-09 MED ORDER — TAZAROTENE 0.1 % EX CREA
1.0000 | TOPICAL_CREAM | Freq: Every day | CUTANEOUS | 3 refills | Status: DC
  Filled 2024-01-09: qty 60, 30d supply, fill #0

## 2024-01-09 NOTE — Telephone Encounter (Signed)
 Last apt 11/24

## 2024-01-10 ENCOUNTER — Other Ambulatory Visit (HOSPITAL_BASED_OUTPATIENT_CLINIC_OR_DEPARTMENT_OTHER): Payer: Self-pay

## 2024-01-10 ENCOUNTER — Other Ambulatory Visit: Payer: Self-pay

## 2024-01-13 ENCOUNTER — Other Ambulatory Visit: Payer: Self-pay

## 2024-01-14 ENCOUNTER — Encounter (HOSPITAL_BASED_OUTPATIENT_CLINIC_OR_DEPARTMENT_OTHER): Payer: Self-pay

## 2024-01-14 ENCOUNTER — Other Ambulatory Visit (HOSPITAL_BASED_OUTPATIENT_CLINIC_OR_DEPARTMENT_OTHER): Payer: Self-pay

## 2024-01-23 ENCOUNTER — Other Ambulatory Visit (HOSPITAL_BASED_OUTPATIENT_CLINIC_OR_DEPARTMENT_OTHER): Payer: Self-pay

## 2024-03-21 ENCOUNTER — Emergency Department (HOSPITAL_BASED_OUTPATIENT_CLINIC_OR_DEPARTMENT_OTHER): Payer: PRIVATE HEALTH INSURANCE

## 2024-03-21 ENCOUNTER — Encounter (HOSPITAL_BASED_OUTPATIENT_CLINIC_OR_DEPARTMENT_OTHER): Payer: Self-pay | Admitting: Emergency Medicine

## 2024-03-21 ENCOUNTER — Emergency Department (HOSPITAL_BASED_OUTPATIENT_CLINIC_OR_DEPARTMENT_OTHER)
Admission: EM | Admit: 2024-03-21 | Discharge: 2024-03-21 | Disposition: A | Discharge status: Home / Self Care | Payer: PRIVATE HEALTH INSURANCE | Attending: Emergency Medicine | Admitting: Emergency Medicine

## 2024-03-21 ENCOUNTER — Other Ambulatory Visit: Payer: Self-pay

## 2024-03-21 DIAGNOSIS — M542 Cervicalgia: Secondary | ICD-10-CM | POA: Diagnosis not present

## 2024-03-21 DIAGNOSIS — Y92410 Unspecified street and highway as the place of occurrence of the external cause: Secondary | ICD-10-CM | POA: Insufficient documentation

## 2024-03-21 DIAGNOSIS — M25572 Pain in left ankle and joints of left foot: Secondary | ICD-10-CM | POA: Insufficient documentation

## 2024-03-21 DIAGNOSIS — M25512 Pain in left shoulder: Secondary | ICD-10-CM | POA: Diagnosis not present

## 2024-03-21 DIAGNOSIS — M25562 Pain in left knee: Secondary | ICD-10-CM | POA: Diagnosis not present

## 2024-03-21 DIAGNOSIS — M25552 Pain in left hip: Secondary | ICD-10-CM | POA: Insufficient documentation

## 2024-03-21 DIAGNOSIS — R519 Headache, unspecified: Secondary | ICD-10-CM | POA: Insufficient documentation

## 2024-03-21 MED ORDER — NAPROXEN 500 MG PO TABS: 500.0000 mg | ORAL_TABLET | Freq: Two times a day (BID) | ORAL | 0 refills | Status: DC

## 2024-03-21 MED ORDER — ACETAMINOPHEN 500 MG PO TABS
1000.0000 mg | ORAL_TABLET | Freq: Once | ORAL | Status: AC
  Administered 2024-03-21: 1000 mg via ORAL
  Filled 2024-03-21: qty 2

## 2024-03-21 MED ORDER — METHOCARBAMOL 500 MG PO TABS
500.0000 mg | ORAL_TABLET | Freq: Two times a day (BID) | ORAL | 0 refills | Status: DC
  Filled 2024-03-21: qty 20, 10d supply, fill #0

## 2024-03-21 MED ORDER — METHOCARBAMOL 500 MG PO TABS
500.0000 mg | ORAL_TABLET | Freq: Once | ORAL | Status: AC
  Administered 2024-03-21: 500 mg via ORAL
  Filled 2024-03-21: qty 1

## 2024-03-21 MED ORDER — NAPROXEN 500 MG PO TABS
500.0000 mg | ORAL_TABLET | Freq: Two times a day (BID) | ORAL | 0 refills | Status: DC
  Filled 2024-03-21: qty 30, 15d supply, fill #0

## 2024-03-21 MED ORDER — METHOCARBAMOL 500 MG PO TABS: 500.0000 mg | ORAL_TABLET | Freq: Two times a day (BID) | ORAL | 0 refills | Status: AC
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
## 2024-03-21 NOTE — Discharge Instructions (Signed)

## 2024-03-23 ENCOUNTER — Other Ambulatory Visit (HOSPITAL_BASED_OUTPATIENT_CLINIC_OR_DEPARTMENT_OTHER): Payer: Self-pay

## 2024-04-02 ENCOUNTER — Other Ambulatory Visit (HOSPITAL_BASED_OUTPATIENT_CLINIC_OR_DEPARTMENT_OTHER): Payer: Self-pay

## 2024-04-09 ENCOUNTER — Ambulatory Visit: Payer: Self-pay
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
## 2024-04-16 ENCOUNTER — Encounter: Payer: Self-pay | Admitting: Family Medicine

## 2024-04-16 ENCOUNTER — Ambulatory Visit (INDEPENDENT_AMBULATORY_CARE_PROVIDER_SITE_OTHER): Payer: PRIVATE HEALTH INSURANCE | Admitting: Family Medicine

## 2024-04-16 VITALS — BP 124/74 | HR 67 | Wt 140.6 lb

## 2024-04-16 DIAGNOSIS — M25562 Pain in left knee: Secondary | ICD-10-CM

## 2024-04-16 DIAGNOSIS — L409 Psoriasis, unspecified: Secondary | ICD-10-CM | POA: Diagnosis not present

## 2024-04-16 MED ORDER — TAZAROTENE 0.1 % EX CREA: 1.0000 | TOPICAL_CREAM | Freq: Every day | CUTANEOUS | 3 refills | Status: AC

## 2024-04-16 MED ORDER — CLOBETASOL PROPIONATE 0.05 % EX CREA: 1.0000 | TOPICAL_CREAM | Freq: Two times a day (BID) | CUTANEOUS | 1 refills | Status: AC

## 2024-04-16 MED ORDER — MELOXICAM 15 MG PO TABS: 15.0000 mg | ORAL_TABLET | Freq: Every day | ORAL | 0 refills | Status: DC

## 2024-04-16 MED ORDER — BETAMETHASONE DIPROPIONATE 0.05 % EX OINT: TOPICAL_OINTMENT | Freq: Two times a day (BID) | CUTANEOUS | 3 refills | Status: AC

## 2024-04-16 MED ORDER — SKYRIZI PEN 150 MG/ML ~~LOC~~ SOAJ: 150.0000 mg | SUBCUTANEOUS | 0 refills | Status: AC
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
## 2024-04-17 ENCOUNTER — Telehealth: Payer: Self-pay | Admitting: Pharmacy Technician

## 2024-04-17 DIAGNOSIS — L409 Psoriasis, unspecified: Secondary | ICD-10-CM

## 2024-04-17 NOTE — Telephone Encounter (Signed)
 Pharmacy Patient Advocate Encounter   Received notification from Onbase that prior authorization for Tazarotene  0.1% cream is required/requested.   Insurance verification completed.   The patient is insured through [employer].   Per test claim:  Brand name Tazorac  0.1% cream is preferred by the insurance.  If suggested medication is appropriate, Please send in a new RX and discontinue this one. If not, please advise as to why it's not appropriate so that we may request a Prior Authorization. Please note, some preferred medications may still require a PA.  If the suggested medications have not been trialed and there are no contraindications to their use, the PA will not be submitted, as it will not be approved.  Can the prescription be resent as Tazorac  0.1% instead of Avage  0.1% cream to allow for insurance coverage?    CMM Key# [id number]

## 2024-04-20 ENCOUNTER — Other Ambulatory Visit (HOSPITAL_COMMUNITY): Payer: Self-pay

## 2024-04-21 ENCOUNTER — Telehealth: Payer: Self-pay | Admitting: Pharmacy Technician

## 2024-04-21 ENCOUNTER — Other Ambulatory Visit (HOSPITAL_COMMUNITY): Payer: Self-pay

## 2024-04-21 NOTE — Telephone Encounter (Signed)
 Pharmacy Patient Advocate Encounter   Received notification from Onbase that prior authorization for Skyrizi  Pen 150MG /ML auto-injectors is required/requested.   Insurance verification completed.   The patient is insured through [employer].   Per test claim: PA required; PA submitted to above mentioned insurance via Latent Key/confirmation #/[facility] Status is pending

## 2024-04-24 ENCOUNTER — Telehealth: Payer: Self-pay

## 2024-04-24 NOTE — Telephone Encounter (Signed)
 Fax from [facility] came asking what grams per location for per application is for the Clobetasol  cream 0.05%

## 2024-04-27 ENCOUNTER — Other Ambulatory Visit: Payer: Self-pay

## 2024-04-27 ENCOUNTER — Other Ambulatory Visit (HOSPITAL_BASED_OUTPATIENT_CLINIC_OR_DEPARTMENT_OTHER): Payer: Self-pay

## 2024-04-27 ENCOUNTER — Other Ambulatory Visit (HOSPITAL_COMMUNITY): Payer: Self-pay

## 2024-04-27 DIAGNOSIS — L409 Psoriasis, unspecified: Secondary | ICD-10-CM

## 2024-04-27 MED ORDER — TAZAROTENE 0.1 % EX GEL: Freq: Every day | CUTANEOUS | 0 refills | Status: AC

## 2024-04-27 MED ORDER — TAZAROTENE 0.1 % EX GEL
Freq: Every day | CUTANEOUS | 0 refills | Status: DC
  Filled 2024-04-27: qty 30, 30d supply, fill #0

## 2024-04-27 NOTE — Telephone Encounter (Signed)
 Pharmacy Patient Advocate Encounter  Received notification from [employer] that Prior Authorization for Skyrizi  Pen 150MG /ML auto-injectors has been APPROVED from 04/21/2025 to 07/25/2024.   PA #/Case ID/Reference #: [id number]

## 2024-05-07 ENCOUNTER — Ambulatory Visit (INDEPENDENT_AMBULATORY_CARE_PROVIDER_SITE_OTHER): Payer: PRIVATE HEALTH INSURANCE | Admitting: Family Medicine

## 2024-05-07 VITALS — BP 128/78 | HR 64 | Wt 144.6 lb

## 2024-05-07 DIAGNOSIS — M25562 Pain in left knee: Secondary | ICD-10-CM | POA: Diagnosis not present

## 2024-05-07 DIAGNOSIS — L409 Psoriasis, unspecified: Secondary | ICD-10-CM

## 2024-05-07 DIAGNOSIS — J4520 Mild intermittent asthma, uncomplicated: Secondary | ICD-10-CM | POA: Diagnosis not present

## 2024-05-07 MED ORDER — METHYLPREDNISOLONE ACETATE 40 MG/ML IJ SUSP
40.0000 mg | Freq: Once | INTRAMUSCULAR | Status: AC
  Administered 2024-05-07: 40 mg via INTRAMUSCULAR

## 2024-05-07 MED ORDER — ALBUTEROL SULFATE HFA 108 (90 BASE) MCG/ACT IN AERS: 2.0000 | INHALATION_SPRAY | Freq: Four times a day (QID) | RESPIRATORY_TRACT | 6 refills | Status: AC | PRN

## 2024-05-07 MED ORDER — LIDOCAINE HCL 1 % IJ SOLN
5.0000 mL | Freq: Once | INTRAMUSCULAR | Status: AC
  Administered 2024-05-07: 5 mL via INTRADERMAL

## 2024-05-07 MED ORDER — MELOXICAM 15 MG PO TABS: 15.0000 mg | ORAL_TABLET | Freq: Every day | ORAL | 0 refills | Status: AC

## 2024-05-07 NOTE — Addendum Note (Signed)
 Addended by: LATTIE CARLO BROCKS on: 05/07/2024 11:57 AM   Modules accepted: Orders

## 2024-05-07 NOTE — Progress Notes (Signed)
" ° °  Name: Christina Cunningham   Date of Visit: 05/07/2024   Date of last visit with me: 04/16/2024   CHIEF COMPLAINT:  Chief Complaint  Patient presents with   Follow-up    3 week follow up still hurting, the medication helped, but did not last.        HPI:  Discussed the use of AI scribe software for clinical note transcription with the patient, who gave verbal consent to proceed.  History of Present Illness   Christina Cunningham is a 44 year old female who presents with persistent knee pain.  She experiences persistent knee pain that has shown approximately 20% improvement over the past three weeks with the use of meloxicam . The pain returns when the medication wears off. She has been using a compression sleeve but forgot to wear it today.  She has been approved for Skyrizi  and has been using Tazorac  cream at night to manage skin flares. The cream helps after removing dead skin.  She experiences difficulty sleeping, describing her sleep pattern as taking naps rather than having a continuous sleep. She has been using melatonin.         OBJECTIVE:       02/14/2022    8:52 AM  Depression screen PHQ 2/9  Decreased Interest 0  Down, Depressed, Hopeless 0  PHQ - 2 Score 0  Altered sleeping 0  Tired, decreased energy 0  Change in appetite 0  Feeling bad or failure about yourself  0  Trouble concentrating 0  Moving slowly or fidgety/restless 0  Suicidal thoughts 0  PHQ-9 Score 0   Difficult doing work/chores Not difficult at all     Data saved with a previous flowsheet row definition     BP Readings from Last 3 Encounters:  05/07/24 128/78  04/16/24 124/74  03/21/24 129/73    BP 128/78   Pulse 64   Wt 144 lb 9.6 oz (65.6 kg)   SpO2 97%   BMI 24.06 kg/m    Physical Exam   MUSCULOSKELETAL: Cold spray applied to knee for pain relief. Steroid injection administered to knee.      Physical Exam  ASSESSMENT/PLAN:   Assessment & Plan Mild intermittent asthma,  unspecified whether complicated  Acute pain of left knee  Psoriasis    Assessment and Plan    Acute left knee pain 20% improvement with meloxicam . Possible meniscus tear. Steroid injection expected to improve pain by 60% in two months. - Administered steroid injection to left knee. - Continue meloxicam  as needed. - Use compression sleeve and ice the knee. - Rest for two days post-injection. - Follow-up in six weeks. - Consider MRI if pain persists after six weeks.  Psoriasis Management with tazarotene . Skyrisa approved but not yet collected. - Call pharmacy to check availability of [name]. - Continue tazarotene  (Tazorac ) at night.      Aspiration/Injection Procedure Note Christina Cunningham 1980/12/30  Procedure: Large Joint Aspiration / Injection of Knee Indications: Pain  Procedure Details Patient written consent to procedure. Risks, benefits, and alternatives explained. Sterilely prepped with alcohol. Ethyl cholride used for anesthesia. 4 cc Lidocaine  1% mixed with 2 mL of DEPO-MEDROL  40 mg injected using the lateral approach without difficulty. No complications with procedure and tolerated well.    Vernon Ariel A. Vita MD [facility] "

## 2024-05-25 ENCOUNTER — Other Ambulatory Visit: Payer: Self-pay

## 2024-07-09 ENCOUNTER — Ambulatory Visit: Payer: PRIVATE HEALTH INSURANCE | Admitting: Family Medicine
